# Patient Record
Sex: Female | Born: 1970 | Race: Black or African American | Hispanic: No | Marital: Single | Smoking: Current every day smoker
Health system: Southern US, Community
[De-identification: ages and names within clinical notes are randomized; demographics above are authoritative.]

## PROBLEM LIST (undated history)

## (undated) HISTORY — PX: TUBAL LIGATION: SHX77

---

## 2016-03-30 ENCOUNTER — Emergency Department (HOSPITAL_COMMUNITY)
Admission: EM | Admit: 2016-03-30 | Discharge: 2016-03-31 | Disposition: A | Discharge status: Home / Self Care | Payer: Self-pay | Attending: Emergency Medicine | Admitting: Emergency Medicine

## 2016-03-30 ENCOUNTER — Encounter (HOSPITAL_COMMUNITY): Payer: Self-pay | Admitting: *Deleted

## 2016-03-30 ENCOUNTER — Emergency Department (HOSPITAL_COMMUNITY): Payer: Self-pay

## 2016-03-30 DIAGNOSIS — R109 Unspecified abdominal pain: Secondary | ICD-10-CM | POA: Insufficient documentation

## 2016-03-30 DIAGNOSIS — F17210 Nicotine dependence, cigarettes, uncomplicated: Secondary | ICD-10-CM | POA: Insufficient documentation

## 2016-03-30 LAB — URINE MICROSCOPIC-ADD ON
BACTERIA UA: NONE SEEN
RBC / HPF: NONE SEEN RBC/hpf (ref 0–5)

## 2016-03-30 LAB — URINALYSIS, ROUTINE W REFLEX MICROSCOPIC
Bilirubin Urine: NEGATIVE
Glucose, UA: NEGATIVE mg/dL
HGB URINE DIPSTICK: NEGATIVE
Ketones, ur: NEGATIVE mg/dL
NITRITE: NEGATIVE
PROTEIN: NEGATIVE mg/dL
SPECIFIC GRAVITY, URINE: 1.016 (ref 1.005–1.030)
pH: 7.5 (ref 5.0–8.0)

## 2016-03-30 LAB — BASIC METABOLIC PANEL
ANION GAP: 9 (ref 5–15)
BUN: 12 mg/dL (ref 6–20)
CO2: 24 mmol/L (ref 22–32)
Calcium: 9.3 mg/dL (ref 8.9–10.3)
Chloride: 106 mmol/L (ref 101–111)
Creatinine, Ser: 0.87 mg/dL (ref 0.44–1.00)
Glucose, Bld: 111 mg/dL — ABNORMAL HIGH (ref 65–99)
POTASSIUM: 3.8 mmol/L (ref 3.5–5.1)
SODIUM: 139 mmol/L (ref 135–145)

## 2016-03-30 LAB — PREGNANCY, URINE: Preg Test, Ur: NEGATIVE

## 2016-03-30 LAB — CBC
HEMATOCRIT: 39.7 % (ref 36.0–46.0)
Hemoglobin: 13.1 g/dL (ref 12.0–15.0)
MCH: 26.8 pg (ref 26.0–34.0)
MCHC: 33 g/dL (ref 30.0–36.0)
MCV: 81.4 fL (ref 78.0–100.0)
Platelets: 338 10*3/uL (ref 150–400)
RBC: 4.88 MIL/uL (ref 3.87–5.11)
RDW: 14.7 % (ref 11.5–15.5)
WBC: 6 10*3/uL (ref 4.0–10.5)

## 2016-03-30 MED ORDER — IBUPROFEN 600 MG PO TABS: 600.0000 mg | ORAL_TABLET | Freq: Four times a day (QID) | ORAL | 0 refills | Status: DC | PRN

## 2016-03-30 MED ORDER — IBUPROFEN 800 MG PO TABS
800.0000 mg | ORAL_TABLET | Freq: Once | ORAL | Status: AC
  Administered 2016-03-30: 800 mg via ORAL
  Filled 2016-03-30: qty 1

## 2016-03-30 MED ORDER — CYCLOBENZAPRINE HCL 10 MG PO TABS
5.0000 mg | ORAL_TABLET | Freq: Once | ORAL | Status: AC
  Administered 2016-03-30: 5 mg via ORAL
  Filled 2016-03-30: qty 1

## 2016-03-30 MED ORDER — CYCLOBENZAPRINE HCL 10 MG PO TABS: 5.0000 mg | ORAL_TABLET | Freq: Two times a day (BID) | ORAL | 0 refills | Status: DC | PRN

## 2016-03-30 NOTE — ED Triage Notes (Addendum)
Pt report sharp right flank pain x3 weeks without any urinary symptoms. She reports that she has had lots of gas nut no other symptoms.  Pt reports that this pain has been intermittentt for 3 weeks and she rates it a 8/10  LBM 3 days ago (which is normal for her)

## 2016-03-30 NOTE — ED Provider Notes (Signed)
[facility] Provider Note   CSN: [id number] Arrival date & time: 03/30/16  1541     History   Chief Complaint Chief Complaint  Patient presents with  . Flank Pain    HPI Ruth Collins is a 45 y.o. female.  HPI  Patient comes to the ER  denying any significant PMH with complaints of bilateral side pain and "twitching". It has been persisting for 3 weeks, she currently rates it at a 2-3/10 and describes it as sore but sometimes sharp pains. 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 After work she hurts the worst and has trouble moving her torso in the morning because of the pain. She has had associated bilateral paraspinal back pain. She denies fever, cough, nausea, vomiting, diarrhea, dysuria, or vaginal discharge/abnormal bleeding. Eating does not make this worse. She has tried antacids without relief.   History reviewed. No pertinent past medical history.  There are no active problems to display for this patient.  Past Surgical History:  Procedure Laterality Date  . TUBAL LIGATION     OB History    No data available     Home Medications    Prior to Admission medications   Not on File    Family History No family history on file.  Social History Social History  Substance Use Topics  . Smoking status: Current Every Day Smoker    Packs/day: 1.00    Types: Cigarettes  . Smokeless tobacco: Never Used  . Alcohol use No     Allergies   Review of patient's allergies indicates no known allergies.   Review of Systems Review of Systems  Review of Systems All other systems negative except as documented in the HPI. All pertinent positives and negatives as reviewed in the HPI.   Physical Exam Updated Vital Signs BP 165/94   Pulse 71   Temp 98.3 F (36.8 C)   Resp 18   Wt 77.1 kg   LMP 03/16/2016   SpO2 100%   Physical Exam  Constitutional: She appears well-developed and well-nourished.  HENT:  Head:  Normocephalic and atraumatic.  Eyes: Conjunctivae are normal. Pupils are equal, round, and reactive to light.  Neck: Trachea normal, normal range of motion and full passive range of motion without pain. Neck supple.  Cardiovascular: Normal rate, regular rhythm and normal pulses.   Pulmonary/Chest: Effort normal and breath sounds normal. Chest wall is not dull to percussion. She exhibits no tenderness, no crepitus, no edema, no deformity and no retraction.  Abdominal: Soft. Normal appearance and bowel sounds are normal. She exhibits no distension. There is no tenderness. There is no rigidity, no rebound, no guarding, no CVA tenderness, no tenderness at McBurney's point and negative Murphy's sign.  Pain reproduced with ROM.  Musculoskeletal: Normal range of motion.  Neurological: She is alert. She has normal strength.  Skin: Skin is warm, dry and intact.  Psychiatric: She has a normal mood and affect. Her speech is normal and behavior is normal. Judgment and thought content normal. Cognition and memory are normal.     ED Treatments / Results  Labs (all labs ordered are listed, but only abnormal results are displayed) Labs Reviewed  URINALYSIS, ROUTINE W REFLEX MICROSCOPIC (NOT AT [facility]) - Abnormal; Notable for the following:       Result Value   Color, Urine AMBER (*)    APPearance CLOUDY (*)    Leukocytes, UA SMALL (*)    All  other components within normal limits  BASIC METABOLIC PANEL - Abnormal; Notable for the following:    Glucose, Bld 111 (*)    All other components within normal limits  URINE MICROSCOPIC-ADD ON - Abnormal; Notable for the following:    Squamous Epithelial / LPF 6-30 (*)    All other components within normal limits  CBC  PREGNANCY, URINE  POC URINE PREG, ED    EKG  EKG Interpretation None       Radiology No results found.  Procedures Procedures (including critical care time)  Medications Ordered in ED Medications  ibuprofen (ADVIL,MOTRIN) tablet  800 mg (not administered)  cyclobenzaprine (FLEXERIL) tablet 5 mg (not administered)    Initial Impression / Assessment and Plan / ED Course  I have reviewed the triage vital signs and the nursing notes.  Pertinent labs & imaging results that were available during my care of the patient were reviewed by me and considered in my medical decision making (see chart for details).  Clinical Course    Patients blood work is unremarkable. Abdominal xray shows some mild stool within colon. At this time her pain is suggestive of musculoskeletal wall pain. She has been lifting boxes and has pain to her torso. We discussed appropriate lifting techniques. Will start her on antiinflammatories and muscle relaxers. Will recommend stretching and warm episome salt baths. Very strict return precautions given such as worsening pain, fevers, n/v//d localization of pain or any other new or changing symptoms.  Final Clinical Impressions(s) / ED Diagnoses   Final diagnoses:  Abdominal pain    New Prescriptions New Prescriptions   No medications on file     Darnelle Goingiffany Adalyn Pennock, PA-C 03/30/16 2332    Linwood DibblesJon Knapp, MD 04/01/16 [phone]

## 2016-03-31 NOTE — ED Notes (Signed)
Patient Alert and oriented X4. Stable and ambulatory. Patient verbalized understanding of the discharge instructions.  Patient belongings were taken by the patient.  

## 2018-06-12 ENCOUNTER — Emergency Department (HOSPITAL_COMMUNITY)
Admission: EM | Admit: 2018-06-12 | Discharge: 2018-06-12 | Disposition: A | Discharge status: Home / Self Care | Payer: Self-pay | Attending: Emergency Medicine | Admitting: Emergency Medicine

## 2018-06-12 ENCOUNTER — Other Ambulatory Visit: Payer: Self-pay

## 2018-06-12 ENCOUNTER — Encounter (HOSPITAL_COMMUNITY): Payer: Self-pay | Admitting: Emergency Medicine

## 2018-06-12 DIAGNOSIS — K0889 Other specified disorders of teeth and supporting structures: Secondary | ICD-10-CM

## 2018-06-12 DIAGNOSIS — K029 Dental caries, unspecified: Secondary | ICD-10-CM | POA: Insufficient documentation

## 2018-06-12 DIAGNOSIS — H0013 Chalazion right eye, unspecified eyelid: Secondary | ICD-10-CM | POA: Insufficient documentation

## 2018-06-12 MED ORDER — PENICILLIN V POTASSIUM 500 MG PO TABS: 500.0000 mg | ORAL_TABLET | Freq: Four times a day (QID) | ORAL | 0 refills | Status: AC

## 2018-06-12 MED ORDER — LIDOCAINE VISCOUS HCL 2 % MT SOLN: 15.0000 mL | OROMUCOSAL | 0 refills | Status: DC | PRN

## 2018-06-12 NOTE — ED Provider Notes (Signed)
[facility] Provider Note   CSN: [id number] Arrival date & time: 06/12/18  0820     History   Chief Complaint Chief Complaint  Patient presents with  . Dental Pain  . Facial Swelling    HPI Ruth Collins is a 47 y.o. female with no significant history presents emergency department today for dental pain.  Patient reports that over the last 2 to 3 weeks she has been dealing with left upper dental pain.  She reports that she is new to the area and was told previously that she needed multiple teeth extracted.  She reports she does not have a dentist in the area and is requesting resources.  She reports she is been taking ibuprofen for symptoms with moderate relief.  Patient denies any drainage.  Patient also reports that she has a chalazion of her right eye.  She denies any visual changes, eye redness, eye irritation, eye pain, photophobia, painful ocular movements, or trauma.  Patient does not wear contacts. Denies fever, chills, voice change, inability to control secretions, nausea/vomiting, dysphagia, odynophagia, drainage.    HPI  History reviewed. No pertinent past medical history.  There are no active problems to display for this patient.   Past Surgical History:  Procedure Laterality Date  . TUBAL LIGATION       OB History   No obstetric history on file.      Home Medications    Prior to Admission medications   Medication Sig Start Date End Date Taking? Authorizing Provider  cyclobenzaprine (FLEXERIL) 10 MG tablet Take 0.5-1 tablets (5-10 mg total) by mouth 2 (two) times daily as needed. 03/30/16   Marlon PelGreene, Tiffany, PA-C  ibuprofen (ADVIL,MOTRIN) 600 MG tablet Take 1 tablet (600 mg total) by mouth every 6 (six) hours as needed. 03/30/16   Marlon PelGreene, Tiffany, PA-C    Family History No family history on file.  Social History Social History   Tobacco Use  . Smoking status: Current Every Day Smoker    Packs/day: 1.00   Types: Cigarettes  . Smokeless tobacco: Never Used  Substance Use Topics  . Alcohol use: No  . Drug use: Not on file     Allergies   Patient has no known allergies.   Review of Systems Review of Systems  Constitutional: Negative for chills and fever.  HENT: Positive for dental problem. Negative for congestion, drooling, ear discharge, ear pain, facial swelling, hearing loss, mouth sores, nosebleeds, postnasal drip, rhinorrhea, sinus pressure, sinus pain, sneezing, sore throat, tinnitus, trouble swallowing and voice change.   Eyes: Negative for photophobia, pain, discharge, redness, itching and visual disturbance.  Musculoskeletal: Negative for neck stiffness.  Neurological: Negative for headaches.     Physical Exam Updated Vital Signs BP (!) 178/96 (BP Location: Right Arm)   Pulse 65   Temp 98 F (36.7 C) (Oral)   Resp 17   Ht 5' 7.5" (1.715 m)   Wt 72.6 kg   LMP 03/16/2016   SpO2 100%   BMI 24.69 kg/m   Physical Exam Vitals signs and nursing note reviewed.  Constitutional:      Appearance: She is well-developed.  HENT:     Head: Normocephalic and atraumatic.     Right Ear: External ear normal.     Left Ear: External ear normal.     Nose: Nose normal.     Mouth/Throat:      Comments: The patient has normal phonation and is in control of secretions. No stridor.  Midline uvula without edema. Soft palate rises symmetrically.  No tonsillar erythema or exudates. No PTA. Tongue protrusion is normal. No trismus. No creptius on neck palpation. Patient has poor dentition.  Patient with tenderness and cavity noted to teeth indicated in diagram.  No facial swelling.  No gingival erythema or fluctuance noted. Mucus membranes moist.  Eyes:     General: No scleral icterus.       Right eye: No discharge.        Left eye: No discharge.     Conjunctiva/sclera: Conjunctivae normal.     Comments: Chalazion of the right eye  Neck:     Comments: No submandibular or submental edema  or lymphadenopathy.  No meningeal signs.  No crepitus on neck palpation. Pulmonary:     Effort: Pulmonary effort is normal. No respiratory distress.  Skin:    Coloration: Skin is not pale.     Comments: No facial rash.   Neurological:     Mental Status: She is alert.      ED Treatments / Results  Labs (all labs ordered are listed, but only abnormal results are displayed) Labs Reviewed - No data to display  EKG None  Radiology No results found.  Procedures Procedures (including critical care time)  Medications Ordered in ED Medications - No data to display   Initial Impression / Assessment and Plan / ED Course  I have reviewed the triage vital signs and the nursing notes.  Pertinent labs & imaging results that were available during my care of the patient were reviewed by me and considered in my medical decision making (see chart for details).     47 y.o. female with Patient with toothache.  No gross abscess.  Exam unconcerning for Ludwig's angina or spread of infection.  Will treat with penicillin and recommend continuing anti-inflammatories medicine.  Urged patient to follow-up with dentist. Patient also with a chalazion of the right eye.  Patient denies any visual changes.  No conjunctival or scleral injection.  Return precautions discussed.  Final Clinical Impressions(s) / ED Diagnoses   Final diagnoses:  Pain, dental    ED Discharge Orders         Ordered    lidocaine (XYLOCAINE) 2 % solution  As needed     06/12/18 0924    penicillin v potassium (VEETID) 500 MG tablet  4 times daily     06/12/18 [id number]           Jacinto HalimMaczis, Harlan Vinal M, PA-C 06/12/18 [id number]    Sabas SousBero, Cyntha Brickman M, MD 06/15/18 [phone]

## 2018-06-12 NOTE — Discharge Instructions (Signed)
Please read and follow all provided instructions.  Your diagnoses today include:  1. Pain, dental     The exam and treatment you received today has been provided on an emergency basis only. This is not a substitute for complete medical or dental care. This problem will not resolve on its own without the care of a dentist.  Tests performed today include: Vital signs. See below for your results today.   Medications prescribed:   Take any prescribed medications only as directed. Use ibuprofen or naproxen for pain. Use the viscous lidocaine for mouth pain. Swish with the lidocaine and spit it out. Do not swallow it.  Please take all of your antibiotics until finished!   You may develop abdominal discomfort or diarrhea from the antibiotic.  You may help offset this with probiotics which you can buy or get in yogurt. Do not eat or take the probiotics until 2 hours after your antibiotic. Do not take your medicine if develop an itchy rash, swelling in your mouth or lips, or difficulty breathing.   Home care instructions:  Follow any educational materials contained in this packet.  Follow-up instructions: Please follow-up with your dentist for further evaluation of your symptoms.   Dental Assistance: See handout for dental referrals  Return instructions:  Please return to the Emergency Department if you experience worsening symptoms. Please return if you develop a fever, you develop more swelling in your face or neck, you have trouble breathing or swallowing food. Please return if you have any other emergent concerns.  Additional Information:  Your vital signs today were: BP (!) 178/96 (BP Location: Right Arm)    Pulse 65    Temp 98 F (36.7 C) (Oral)    Resp 17    Ht 5' 7.5" (1.715 m)    Wt 72.6 kg    LMP 03/16/2016    SpO2 100%    BMI 24.69 kg/m  If your blood pressure (BP) was elevated above 135/85 this visit, please have this repeated by your doctor within one  month. --------------

## 2018-06-12 NOTE — ED Triage Notes (Signed)
Pt. Stated, I ve had a toothache for 2 weeks and the swelling started yesterday. My eyes are swollen today.

## 2019-11-20 ENCOUNTER — Encounter (HOSPITAL_COMMUNITY): Payer: Self-pay

## 2019-11-20 ENCOUNTER — Other Ambulatory Visit: Payer: Self-pay

## 2019-11-20 ENCOUNTER — Ambulatory Visit (HOSPITAL_COMMUNITY)
Admission: EM | Admit: 2019-11-20 | Discharge: 2019-11-20 | Disposition: A | Discharge status: Home / Self Care | Payer: 59 | Attending: Emergency Medicine | Admitting: Emergency Medicine

## 2019-11-20 DIAGNOSIS — R05 Cough: Secondary | ICD-10-CM | POA: Diagnosis not present

## 2019-11-20 DIAGNOSIS — R059 Cough, unspecified: Secondary | ICD-10-CM

## 2019-11-20 DIAGNOSIS — J019 Acute sinusitis, unspecified: Secondary | ICD-10-CM | POA: Diagnosis not present

## 2019-11-20 MED ORDER — FLUTICASONE PROPIONATE 50 MCG/ACT NA SUSP: 1.0000 | Freq: Every day | NASAL | 0 refills | Status: DC

## 2019-11-20 MED ORDER — PREDNISONE 50 MG PO TABS: 50.0000 mg | ORAL_TABLET | Freq: Every day | ORAL | 0 refills | Status: AC

## 2019-11-20 MED ORDER — BENZONATATE 200 MG PO CAPS: 200.0000 mg | ORAL_CAPSULE | Freq: Three times a day (TID) | ORAL | 0 refills | Status: AC | PRN

## 2019-11-20 MED ORDER — DOXYCYCLINE HYCLATE 100 MG PO CAPS: 100.0000 mg | ORAL_CAPSULE | Freq: Two times a day (BID) | ORAL | 0 refills | Status: AC

## 2019-11-20 MED ORDER — CETIRIZINE HCL 10 MG PO CAPS: 10.0000 mg | ORAL_CAPSULE | Freq: Every day | ORAL | 0 refills | Status: DC

## 2019-11-20 NOTE — ED Triage Notes (Signed)
C/o productive cough x1 month. Reports that when she wakes up in the morning there is a thick layer of mucous on the roof of her mouth. Denies any other symptoms. Denies pain.

## 2019-11-20 NOTE — ED Provider Notes (Signed)
[facility]    CSN: [id number] Arrival date & time: 11/20/19  1043      History   Chief Complaint Chief Complaint  Patient presents with  . Cough    productive    HPI Ruth Collins is a 49 y.o. female presenting today for evaluation of a cough. Patient has had a cough for the past 1 month. The cough is productive and reports a lot of mucus in throat/mouth especially in the mornings. She has had some associated sneezing and watery eyes along with rhinorrhea. Denies fevers. Denies chest pain or shortness of breath. Does report tobacco use, approximately 0.75 pack per day. Has used some Benadryl, but no significant use of OTC meds for symptom relief.  HPI  History reviewed. No pertinent past medical history.  There are no problems to display for this patient.   Past Surgical History:  Procedure Laterality Date  . TUBAL LIGATION      OB History   No obstetric history on file.      Home Medications    Prior to Admission medications   Medication Sig Start Date End Date Taking? Authorizing Provider  benzonatate (TESSALON) 200 MG capsule Take 1 capsule (200 mg total) by mouth 3 (three) times daily as needed for up to 7 days for cough. 11/20/19 11/27/19  Jetty Berland C, PA-C  Cetirizine HCl 10 MG CAPS Take 1 capsule (10 mg total) by mouth daily for 10 days. 11/20/19 11/30/19  Lorcan Shelp C, PA-C  cyclobenzaprine (FLEXERIL) 10 MG tablet Take 0.5-1 tablets (5-10 mg total) by mouth 2 (two) times daily as needed. 03/30/16   Marlon Pel, PA-C  doxycycline (VIBRAMYCIN) 100 MG capsule Take 1 capsule (100 mg total) by mouth 2 (two) times daily for 10 days. 11/20/19 11/30/19  Kyandra Mcclaine C, PA-C  fluticasone (FLONASE) 50 MCG/ACT nasal spray Place 1-2 sprays into both nostrils daily for 7 days. 11/20/19 11/27/19  Rylee Nuzum C, PA-C  ibuprofen (ADVIL,MOTRIN) 600 MG tablet Take 1 tablet (600 mg total) by mouth every 6 (six) hours as needed. 03/30/16   Marlon Pel,  PA-C  lidocaine (XYLOCAINE) 2 % solution Use as directed 15 mLs in the mouth or throat as needed for mouth pain. 06/12/18   Maczis, Elmer Sow, PA-C  predniSONE (DELTASONE) 50 MG tablet Take 1 tablet (50 mg total) by mouth daily for 5 days. 11/20/19 11/25/19  Leonarda Leis, Junius Creamer, PA-C    Family History No family history on file.  Social History Social History   Tobacco Use  . Smoking status: Current Every Day Smoker    Packs/day: 0.75    Types: Cigarettes  . Smokeless tobacco: Never Used  Substance Use Topics  . Alcohol use: No  . Drug use: Not on file     Allergies   Patient has no known allergies.   Review of Systems Review of Systems  Constitutional: Negative for activity change, appetite change, chills, fatigue and fever.  HENT: Positive for congestion and rhinorrhea. Negative for ear pain, sinus pressure, sore throat and trouble swallowing.   Eyes: Negative for discharge and redness.  Respiratory: Positive for cough. Negative for chest tightness and shortness of breath.   Cardiovascular: Negative for chest pain.  Gastrointestinal: Negative for abdominal pain, diarrhea, nausea and vomiting.  Musculoskeletal: Negative for myalgias.  Skin: Negative for rash.  Neurological: Negative for dizziness, light-headedness and headaches.     Physical Exam Triage Vital Signs ED Triage Vitals  Enc Vitals Group  BP 11/20/19 1223 (!) 157/84     Pulse Rate 11/20/19 1223 66     Resp 11/20/19 1223 14     Temp 11/20/19 1223 98.4 F (36.9 C)     Temp Source 11/20/19 1223 Oral     SpO2 11/20/19 1223 100 %     Weight --      Height --      Head Circumference --      Peak Flow --      Pain Score 11/20/19 1222 0     Pain Loc --      Pain Edu? --      Excl. in [place]? --    No data found.  Updated Vital Signs BP (!) 157/84 (BP Location: Right Arm)   Pulse 66   Temp 98.4 F (36.9 C) (Oral)   Resp 14   LMP 03/16/2016   SpO2 100%   Visual Acuity Right Eye Distance:   Left Eye  Distance:   Bilateral Distance:    Right Eye Near:   Left Eye Near:    Bilateral Near:     Physical Exam Vitals and nursing note reviewed.  Constitutional:      Appearance: She is well-developed.     Comments: No acute distress  HENT:     Head: Normocephalic and atraumatic.     Ears:     Comments: Bilateral ears without tenderness to palpation of external auricle, tragus and mastoid, EAC's without erythema or swelling, TM's with good bony landmarks and cone of light. Non erythematous.     Nose: Nose normal.     Mouth/Throat:     Comments: Oral mucosa pink and moist, no tonsillar enlargement or exudate. Posterior pharynx patent and nonerythematous, no uvula deviation or swelling. Normal phonation.  Eyes:     Conjunctiva/sclera: Conjunctivae normal.  Cardiovascular:     Rate and Rhythm: Normal rate.  Pulmonary:     Effort: Pulmonary effort is normal. No respiratory distress.     Comments: Breathing comfortably at rest, CTABL, no wheezing, rales or other adventitious sounds auscultated Abdominal:     General: There is no distension.  Musculoskeletal:        General: Normal range of motion.     Cervical back: Neck supple.  Skin:    General: Skin is warm and dry.  Neurological:     Mental Status: She is alert and oriented to person, place, and time.      UC Treatments / Results  Labs (all labs ordered are listed, but only abnormal results are displayed) Labs Reviewed - No data to display  EKG   Radiology No results found.  Procedures Procedures (including critical care time)  Medications Ordered in UC Medications - No data to display  Initial Impression / Assessment and Plan / UC Course  I have reviewed the triage vital signs and the nursing notes.  Pertinent labs & imaging results that were available during my care of the patient were reviewed by me and considered in my medical decision making (see chart for details).     URI symptoms and cough x1 month,  lungs clear, vital signs stable, treating for sinusitis/bronchitis and initiated on doxycycline, prednisone along with cetirizine and Flonase and Tessalon for further symptomatic relief. Rest and fluids.  Discussed strict return precautions. Patient verbalized understanding and is agreeable with plan.  Final Clinical Impressions(s) / UC Diagnoses   Final diagnoses:  Acute sinusitis with symptoms > 10 days  Cough  Discharge Instructions     Please begin doxycycline twice daily x5 days Prednisone daily x5 days, take with food and in the morning Begin daily cetirizine and Flonase nasal spray 1 to 2 spray in each nostril daily benzonatate/Tessalon every 8 hours as needed for deformity Rest and fluids  Please follow-up if any symptoms not improving or worsening    ED Prescriptions    Medication Sig Dispense Auth. Provider   doxycycline (VIBRAMYCIN) 100 MG capsule Take 1 capsule (100 mg total) by mouth 2 (two) times daily for 10 days. 20 capsule Gretta Samons C, PA-C   predniSONE (DELTASONE) 50 MG tablet Take 1 tablet (50 mg total) by mouth daily for 5 days. 5 tablet Ryder Chesmore C, PA-C   benzonatate (TESSALON) 200 MG capsule Take 1 capsule (200 mg total) by mouth 3 (three) times daily as needed for up to 7 days for cough. 28 capsule Jacelyn Cuen C, PA-C   Cetirizine HCl 10 MG CAPS Take 1 capsule (10 mg total) by mouth daily for 10 days. 10 capsule Dariel Pellecchia C, PA-C   fluticasone (FLONASE) 50 MCG/ACT nasal spray Place 1-2 sprays into both nostrils daily for 7 days. 1 g Fannye Myer, [place] C, PA-C     PDMP not reviewed this encounter.   Lew Dawes, [place] 11/20/19 1251

## 2019-11-20 NOTE — Discharge Instructions (Signed)
Please begin doxycycline twice daily x5 days Prednisone daily x5 days, take with food and in the morning Begin daily cetirizine and Flonase nasal spray 1 to 2 spray in each nostril daily benzonatate/Tessalon every 8 hours as needed for deformity Rest and fluids  Please follow-up if any symptoms not improving or worsening

## 2019-12-22 ENCOUNTER — Ambulatory Visit (HOSPITAL_COMMUNITY)
Admission: EM | Admit: 2019-12-22 | Discharge: 2019-12-22 | Disposition: A | Discharge status: Home / Self Care | Payer: 59 | Attending: Family Medicine | Admitting: Family Medicine

## 2019-12-22 ENCOUNTER — Encounter (HOSPITAL_COMMUNITY): Payer: Self-pay

## 2019-12-22 ENCOUNTER — Other Ambulatory Visit: Payer: Self-pay

## 2019-12-22 DIAGNOSIS — M62838 Other muscle spasm: Secondary | ICD-10-CM | POA: Diagnosis not present

## 2019-12-22 DIAGNOSIS — M791 Myalgia, unspecified site: Secondary | ICD-10-CM | POA: Diagnosis not present

## 2019-12-22 DIAGNOSIS — M62830 Muscle spasm of back: Secondary | ICD-10-CM | POA: Diagnosis not present

## 2019-12-22 MED ORDER — CYCLOBENZAPRINE HCL 10 MG PO TABS: 10.0000 mg | ORAL_TABLET | Freq: Two times a day (BID) | ORAL | 0 refills | Status: DC | PRN

## 2019-12-22 NOTE — ED Provider Notes (Signed)
[facility]   [id number] 12/22/19 Arrival Time: 1004  [id number] PAIN  SUBJECTIVE: History from: patient. Ruth Collins is a 49 y.o. female complains of bilateral leg cramps and muscle spasms.  Also reports back spasms and pain to both sides of her low back.  Reports neck pain and spasms as well.  Reports that she is taken Advil with no relief.  Denies taking any muscle relaxers.  Reports this is been going on for the last 3 days.  Denies a precipitating event or specific injury.  Symptoms are made worse with activity.  Denies similar symptoms in the past.  Denies fever, chills, erythema, ecchymosis, effusion, weakness, numbness and tingling, saddle paresthesias, loss of bowel or bladder function.      ROS: As per HPI.  All other pertinent ROS negative.     History reviewed. No pertinent past medical history. Past Surgical History:  Procedure Laterality Date  . TUBAL LIGATION     No Known Allergies No current facility-administered medications on file prior to encounter.   Current Outpatient Medications on File Prior to Encounter  Medication Sig Dispense Refill  . Cetirizine HCl 10 MG CAPS Take 1 capsule (10 mg total) by mouth daily for 10 days. 10 capsule 0  . fluticasone (FLONASE) 50 MCG/ACT nasal spray Place 1-2 sprays into both nostrils daily for 7 days. 1 g 0  . ibuprofen (ADVIL,MOTRIN) 600 MG tablet Take 1 tablet (600 mg total) by mouth every 6 (six) hours as needed. 30 tablet 0  . lidocaine (XYLOCAINE) 2 % solution Use as directed 15 mLs in the mouth or throat as needed for mouth pain. 100 mL 0   Social History   Socioeconomic History  . Marital status: Single    Spouse name: Not on file  . Number of children: Not on file  . Years of education: Not on file  . Highest education level: Not on file  Occupational History  . Not on file  Tobacco Use  . Smoking status: Current Every Day Smoker    Packs/day: 0.75    Types: Cigarettes  . Smokeless tobacco: Never  Used  Vaping Use  . Vaping Use: Never used  Substance and Sexual Activity  . Alcohol use: No  . Drug use: Not on file  . Sexual activity: Not on file  Other Topics Concern  . Not on file  Social History Narrative  . Not on file   Social Determinants of Health   Financial Resource Strain:   . Difficulty of Paying Living Expenses:   Food Insecurity:   . Worried About Programme researcher, broadcasting/film/video in the Last Year:   . Barista in the Last Year:   Transportation Needs:   . Freight forwarder (Medical):   Marland Kitchen Lack of Transportation (Non-Medical):   Physical Activity:   . Days of Exercise per Week:   . Minutes of Exercise per Session:   Stress:   . Feeling of Stress :   Social Connections:   . Frequency of Communication with Friends and Family:   . Frequency of Social Gatherings with Friends and Family:   . Attends Religious Services:   . Active Member of Clubs or Organizations:   . Attends Banker Meetings:   Marland Kitchen Marital Status:   Intimate Partner Violence:   . Fear of Current or Ex-Partner:   . Emotionally Abused:   Marland Kitchen Physically Abused:   . Sexually Abused:    History reviewed. No pertinent family  history.  OBJECTIVE:  Vitals:   12/22/19 1037  BP: (!) 142/96  Pulse: 77  Resp: 18  Temp: 98.3 F (36.8 C)  TempSrc: Oral  SpO2: 100%    General appearance: ALERT; in no acute distress.  Head: NCAT Lungs: Normal respiratory effort CV: XX pulses 2+ bilaterally. Cap refill < 2 seconds Musculoskeletal:  Inspection: Skin warm, dry, clear and intact without obvious erythema, effusion, or ecchymosis.  Palpation: Mid back bilaterally in spasm and tender to palpation ROM: FROM active and passive Skin: warm and dry Neurologic: Ambulates without difficulty; Sensation intact about the upper/ lower extremities Psychological: alert and cooperative; normal mood and affect  DIAGNOSTIC STUDIES:  No results found.   ASSESSMENT & PLAN:  1. Muscle pain   2.  Muscle spasms of both lower extremities   3. Muscle spasm of back   4. Muscle spasms of neck       Meds ordered this encounter  Medications  . cyclobenzaprine (FLEXERIL) 10 MG tablet    Sig: Take 1 tablet (10 mg total) by mouth 2 (two) times daily as needed for muscle spasms.    Dispense:  20 tablet    Refill:  0    Order Specific Question:   Supervising Provider    Answer:   Merrilee Jansky [id number]    Continue conservative management of rest, ice, and gentle stretches Take ibuprofen as needed for pain relief (may cause abdominal discomfort, ulcers, and GI bleeds avoid taking with other NSAIDs) Take cyclobenzaprine at nighttime for symptomatic relief. Avoid driving or operating heavy machinery while using medication. May use ice or heat May supplement with magnesium as well Follow up with PCP if symptoms persist Return or go to the ER if you have any new or worsening symptoms (fever, chills, chest pain, abdominal pain, changes in bowel or bladder habits, pain radiating into lower legs)   Reviewed expectations re: course of current medical issues. Questions answered. Outlined signs and symptoms indicating need for more acute intervention. Patient verbalized understanding. After Visit Summary given.       Moshe Cipro, NP 12/22/19 1123

## 2019-12-22 NOTE — Discharge Instructions (Signed)
I sent in a muscle relaxer for you to take  You may take the muscle relaxer cyclobenzaprine twice daily as needed for muscle spasms.  This medication can make you very sleepy.  I would also have you pick up plain magnesium or magnesium chloride at least 300 mg daily to help with muscle spasms and pain  Follow-up with this office or primary care if you are not improving  Follow-up with the ER for unrelenting pain, trouble swallowing, trouble breathing, other concerning symptoms

## 2019-12-22 NOTE — ED Triage Notes (Signed)
Pt present sharp pain that starts from the bottom of both feet and works it way up to the her abdominal area. Symptoms started 3 days ago. Pt describe pain as a sharp pain like muscle spasms.

## 2020-10-09 ENCOUNTER — Encounter (HOSPITAL_COMMUNITY): Payer: Self-pay | Admitting: Emergency Medicine

## 2020-10-09 ENCOUNTER — Other Ambulatory Visit: Payer: Self-pay

## 2020-10-09 ENCOUNTER — Ambulatory Visit (HOSPITAL_COMMUNITY)
Admission: EM | Admit: 2020-10-09 | Discharge: 2020-10-09 | Disposition: A | Discharge status: Home / Self Care | Payer: BC Managed Care – PPO | Attending: Physician Assistant | Admitting: Physician Assistant

## 2020-10-09 DIAGNOSIS — Z20822 Contact with and (suspected) exposure to covid-19: Secondary | ICD-10-CM | POA: Diagnosis not present

## 2020-10-09 DIAGNOSIS — F17210 Nicotine dependence, cigarettes, uncomplicated: Secondary | ICD-10-CM | POA: Insufficient documentation

## 2020-10-09 DIAGNOSIS — J069 Acute upper respiratory infection, unspecified: Secondary | ICD-10-CM | POA: Insufficient documentation

## 2020-10-09 LAB — POC INFLUENZA A AND B ANTIGEN (URGENT CARE ONLY)
INFLUENZA A ANTIGEN, POC: NEGATIVE
INFLUENZA B ANTIGEN, POC: NEGATIVE

## 2020-10-09 LAB — SARS CORONAVIRUS 2 (TAT 6-24 HRS): SARS Coronavirus 2: NEGATIVE

## 2020-10-09 MED ORDER — BENZONATATE 100 MG PO CAPS: 100.0000 mg | ORAL_CAPSULE | Freq: Three times a day (TID) | ORAL | 0 refills | Status: DC

## 2020-10-09 MED ORDER — PROMETHAZINE-DM 6.25-15 MG/5ML PO SYRP: 5.0000 mL | ORAL_SOLUTION | Freq: Every evening | ORAL | 0 refills | Status: DC | PRN

## 2020-10-09 NOTE — Discharge Instructions (Addendum)
Your flu testing was negative.  We will be in touch with your COVID-19 results as soon as we have them.  Use Promethazine DM for nocturnal cough symptoms but do not drive or drink alcohol with this medication as drowsiness is a common side effect.  He can use Tessalon for cough during the day.  Please use Mucinex and Flonase for additional symptom relief.  Make sure you drink plenty of fluid.  If anything worsens please return for further evaluation.

## 2020-10-09 NOTE — ED Triage Notes (Signed)
Pt presents today with c/o of sore throat and headache that began yesterday. Denies fever.

## 2020-10-09 NOTE — ED Provider Notes (Signed)
[facility]    CSN: [id number] Arrival date & time: 10/09/20  2423      History   Chief Complaint Chief Complaint  Patient presents with  . Sore Throat  . Headache  . Nasal Congestion    HPI Ruth Collins is a 50 y.o. female.   Patient presents today with a 2-day history of sore throat.  Reports associated headache, rhinorrhea, postnasal drainage, fatigue, diarrhea, sinus pressure.  Denies fever, chest pain, shortness of breath, nausea, vomiting.  She has tried Benadryl without improvement of symptoms.  She has a history of seasonal allergies but reports current symptoms are more intense than typical allergies.  She denies any known sick contacts.  Denies any recent antibiotic use.  She does not receive influenza or COVID-19 vaccinations.  She is a current everyday smoker smoking approximately 0.5 packs/day which is her typical amount.  Denies history of asthma or COPD.  She has missed work as a result of symptoms and is requesting work excuse note today.     History reviewed. No pertinent past medical history.  There are no problems to display for this patient.   Past Surgical History:  Procedure Laterality Date  . TUBAL LIGATION      OB History   No obstetric history on file.      Home Medications    Prior to Admission medications   Medication Sig Start Date End Date Taking? Authorizing Provider  benzonatate (TESSALON) 100 MG capsule Take 1 capsule (100 mg total) by mouth every 8 (eight) hours. 10/09/20  Yes Alic Hilburn K, PA-C  diphenhydrAMINE (BENADRYL) 50 MG capsule Take 50 mg by mouth every 6 (six) hours as needed.   Yes [provider]  ibuprofen (ADVIL,MOTRIN) 600 MG tablet Take 1 tablet (600 mg total) by mouth every 6 (six) hours as needed. 03/30/16  Yes Neva Seat, Tiffany, PA-C  promethazine-dextromethorphan (PROMETHAZINE-DM) 6.25-15 MG/5ML syrup Take 5 mLs by mouth at bedtime as needed for cough. 10/09/20  Yes Capricia Serda, Denny Peon K, PA-C   Cetirizine HCl 10 MG CAPS Take 1 capsule (10 mg total) by mouth daily for 10 days. 11/20/19 11/30/19  Wieters, Hallie C, PA-C  fluticasone (FLONASE) 50 MCG/ACT nasal spray Place 1-2 sprays into both nostrils daily for 7 days. 11/20/19 11/27/19  Wieters, Junius Creamer, PA-C    Family History Family History  Problem Relation Age of Onset  . Diabetes Mother   . Hypertension Mother   . Diabetes Father   . Hypertension Father     Social History Social History   Tobacco Use  . Smoking status: Current Every Day Smoker    Packs/day: 0.75    Types: Cigarettes  . Smokeless tobacco: Never Used  Vaping Use  . Vaping Use: Never used  Substance Use Topics  . Alcohol use: No  . Drug use: Never     Allergies   Patient has no known allergies.   Review of Systems Review of Systems  Constitutional: Positive for activity change and fatigue. Negative for appetite change and fever.  HENT: Positive for congestion, postnasal drip, sinus pressure and sore throat. Negative for ear pain and sneezing.   Respiratory: Positive for cough. Negative for shortness of breath.   Cardiovascular: Negative for chest pain.  Gastrointestinal: Positive for diarrhea. Negative for abdominal pain, nausea and vomiting.  Musculoskeletal: Positive for back pain. Negative for arthralgias and myalgias.  Neurological: Positive for headaches. Negative for dizziness and light-headedness.     Physical Exam Triage Vital Signs ED  Triage Vitals  Enc Vitals Group     BP 10/09/20 1139 (!) 162/99     Pulse Rate 10/09/20 1139 77     Resp 10/09/20 1139 18     Temp 10/09/20 1139 98.5 F (36.9 C)     Temp Source 10/09/20 1139 Oral     SpO2 10/09/20 1139 99 %     Weight --      Height --      Head Circumference --      Peak Flow --      Pain Score 10/09/20 1136 6     Pain Loc --      Pain Edu? --      Excl. in GC? --    No data found.  Updated Vital Signs BP (!) 162/99 (BP Location: Right Arm)   Pulse 77   Temp 98.5 F  (36.9 C) (Oral)   Resp 18   LMP 03/16/2016   SpO2 99%   Visual Acuity Right Eye Distance:   Left Eye Distance:   Bilateral Distance:    Right Eye Near:   Left Eye Near:    Bilateral Near:     Physical Exam Vitals reviewed.  Constitutional:      General: She is awake. She is not in acute distress.    Appearance: Normal appearance. She is not ill-appearing.     Comments: Very pleasant female appears stated age in no acute distress  HENT:     Head: Normocephalic and atraumatic.     Right Ear: Ear canal and external ear normal. A middle ear effusion is present. Tympanic membrane is not erythematous or bulging.     Left Ear: Ear canal and external ear normal. A middle ear effusion is present. Tympanic membrane is not erythematous or bulging.     Nose:     Right Sinus: Frontal sinus tenderness present. No maxillary sinus tenderness.     Left Sinus: Frontal sinus tenderness present. No maxillary sinus tenderness.     Mouth/Throat:     Pharynx: Uvula midline. Posterior oropharyngeal erythema present. No oropharyngeal exudate.     Comments: Moderate erythema and drainage in posterior oropharynx Cardiovascular:     Rate and Rhythm: Normal rate and regular rhythm.     Heart sounds: No murmur heard.   Pulmonary:     Effort: Pulmonary effort is normal.     Breath sounds: Normal breath sounds. No wheezing, rhonchi or rales.     Comments: Clear to auscultation bilaterally Lymphadenopathy:     Head:     Right side of head: No submental, submandibular or tonsillar adenopathy.     Left side of head: No submental, submandibular or tonsillar adenopathy.     Cervical: No cervical adenopathy.  Psychiatric:        Behavior: Behavior is cooperative.      UC Treatments / Results  Labs (all labs ordered are listed, but only abnormal results are displayed) Labs Reviewed  SARS CORONAVIRUS 2 (TAT 6-24 HRS)  POC INFLUENZA A AND B ANTIGEN (URGENT CARE ONLY)    EKG   Radiology No  results found.  Procedures Procedures (including critical care time)  Medications Ordered in UC Medications - No data to display  Initial Impression / Assessment and Plan / UC Course  I have reviewed the triage vital signs and the nursing notes.  Pertinent labs & imaging results that were available during my care of the patient were reviewed by me and considered in my medical  decision making (see chart for details).     Flu testing was negative in office today.  COVID-19 testing pending.  Suspect viral etiology given short duration.  Patient prescribed Promethazine DM for nocturnal cough symptoms with instruction not to drive or drink alcohol with this medication as drowsiness is a common side effect.  She can use Tessalon for diurnal cough symptoms.  Recommended Mucinex and Flonase for symptom relief.  Strict return precautions given to which patient expressed understanding.  Final Clinical Impressions(s) / UC Diagnoses   Final diagnoses:  Viral URI with cough     Discharge Instructions     Your flu testing was negative.  We will be in touch with your COVID-19 results as soon as we have them.  Use Promethazine DM for nocturnal cough symptoms but do not drive or drink alcohol with this medication as drowsiness is a common side effect.  He can use Tessalon for cough during the day.  Please use Mucinex and Flonase for additional symptom relief.  Make sure you drink plenty of fluid.  If anything worsens please return for further evaluation.    ED Prescriptions    Medication Sig Dispense Auth. Provider   promethazine-dextromethorphan (PROMETHAZINE-DM) 6.25-15 MG/5ML syrup Take 5 mLs by mouth at bedtime as needed for cough. 118 mL Alyra Patty K, PA-C   benzonatate (TESSALON) 100 MG capsule Take 1 capsule (100 mg total) by mouth every 8 (eight) hours. 21 capsule Alyaan Budzynski K, PA-C     PDMP not reviewed this encounter.   Jeani Hawking, PA-C 10/09/20 1232

## 2021-08-09 ENCOUNTER — Other Ambulatory Visit: Payer: Self-pay

## 2021-08-09 ENCOUNTER — Ambulatory Visit (HOSPITAL_COMMUNITY)
Admission: EM | Admit: 2021-08-09 | Discharge: 2021-08-09 | Disposition: A | Discharge status: Home / Self Care | Payer: BC Managed Care – PPO | Attending: Physician Assistant | Admitting: Physician Assistant

## 2021-08-09 ENCOUNTER — Encounter (HOSPITAL_COMMUNITY): Payer: Self-pay | Admitting: Emergency Medicine

## 2021-08-09 DIAGNOSIS — J019 Acute sinusitis, unspecified: Secondary | ICD-10-CM | POA: Diagnosis not present

## 2021-08-09 DIAGNOSIS — K219 Gastro-esophageal reflux disease without esophagitis: Secondary | ICD-10-CM

## 2021-08-09 MED ORDER — PROMETHAZINE-DM 6.25-15 MG/5ML PO SYRP: 5.0000 mL | ORAL_SOLUTION | Freq: Four times a day (QID) | ORAL | 0 refills | Status: DC | PRN

## 2021-08-09 MED ORDER — FLUTICASONE PROPIONATE 50 MCG/ACT NA SUSP: 2.0000 | Freq: Every day | NASAL | 2 refills | Status: DC

## 2021-08-09 MED ORDER — AMOXICILLIN-POT CLAVULANATE 875-125 MG PO TABS: 1.0000 | ORAL_TABLET | Freq: Two times a day (BID) | ORAL | 0 refills | Status: DC

## 2021-08-09 NOTE — Discharge Instructions (Addendum)
Take medications as prescribed Drink plenty of fluids Rest Recommend omeprazole over the counter daily for acid reflux

## 2021-08-09 NOTE — ED Triage Notes (Addendum)
Extremities sore, epigastric pain, throat dry and runny nose started last week.  Complains of cold and hot spells  Left fingertip numbness in mornings for 2 months.

## 2021-08-09 NOTE — ED Provider Notes (Signed)
[facility]    CSN: [id number] Arrival date & time: 08/09/21  1028      History   Chief Complaint Chief Complaint  Patient presents with   Abdominal Pain    HPI Ruth Collins is a 51 y.o. female.   Pt complains of congestion, cough, and headache that started a week ago.  She reports cough is worse at night when lying flat.  She also complains of body aches. She has taken a home COVID test which was negative.  Pt has tried Mucinex with minimal relief.  She also reports some epigastric discomfort that started several weeks ago.  Denies radiation of pain, feels better after eating.  She has taken nothing for the discomfort.  Reports she has experienced more burping than normal.    History reviewed. No pertinent past medical history.  There are no problems to display for this patient.   Past Surgical History:  Procedure Laterality Date   TUBAL LIGATION      OB History   No obstetric history on file.      Home Medications    Prior to Admission medications   Medication Sig Start Date End Date Taking? Authorizing Provider  amoxicillin-clavulanate (AUGMENTIN) 875-125 MG tablet Take 1 tablet by mouth every 12 (twelve) hours. 08/09/21  Yes Ward, Tylene Fantasia, PA-C  fluticasone (FLONASE) 50 MCG/ACT nasal spray Place 2 sprays into both nostrils daily. 08/09/21  Yes Ward, Tylene Fantasia, PA-C  promethazine-dextromethorphan (PROMETHAZINE-DM) 6.25-15 MG/5ML syrup Take 5 mLs by mouth 4 (four) times daily as needed for cough. 08/09/21  Yes Ward, Tylene Fantasia, PA-C  benzonatate (TESSALON) 100 MG capsule Take 1 capsule (100 mg total) by mouth every 8 (eight) hours. Patient not taking: Reported on 08/09/2021 10/09/20   Raspet, Noberto Retort, PA-C  Cetirizine HCl 10 MG CAPS Take 1 capsule (10 mg total) by mouth daily for 10 days. Patient not taking: Reported on 08/09/2021 11/20/19 11/30/19  Wieters, Hallie C, PA-C  diphenhydrAMINE (BENADRYL) 50 MG capsule Take 50 mg by mouth every 6 (six) hours as  needed. Patient not taking: Reported on 08/09/2021    [provider]  ibuprofen (ADVIL,MOTRIN) 600 MG tablet Take 1 tablet (600 mg total) by mouth every 6 (six) hours as needed. 03/30/16   Marlon Pel, PA-C    Family History Family History  Problem Relation Age of Onset   Diabetes Mother    Hypertension Mother    Diabetes Father    Hypertension Father     Social History Social History   Tobacco Use   Smoking status: Every Day    Packs/day: 0.75    Types: Cigarettes   Smokeless tobacco: Never  Vaping Use   Vaping Use: Never used  Substance Use Topics   Alcohol use: No   Drug use: Never     Allergies   Patient has no known allergies.   Review of Systems Review of Systems  Constitutional:  Negative for chills and fever.  HENT:  Positive for congestion. Negative for ear pain and sore throat.   Eyes:  Negative for pain and visual disturbance.  Respiratory:  Positive for cough. Negative for shortness of breath and wheezing.   Cardiovascular:  Negative for chest pain and palpitations.  Gastrointestinal:  Positive for abdominal pain (epigastric discomfort). Negative for vomiting.  Genitourinary:  Negative for dysuria and hematuria.  Musculoskeletal:  Positive for myalgias. Negative for back pain.  Skin:  Negative for color change and rash.  Neurological:  Negative for seizures  and syncope.  All other systems reviewed and are negative.   Physical Exam Triage Vital Signs ED Triage Vitals  Enc Vitals Group     BP 08/09/21 1137 (!) 147/84     Pulse Rate 08/09/21 1137 78     Resp 08/09/21 1137 20     Temp 08/09/21 1137 97.9 F (36.6 C)     Temp Source 08/09/21 1137 Oral     SpO2 08/09/21 1137 99 %     Weight --      Height --      Head Circumference --      Peak Flow --      Pain Score 08/09/21 1135 6     Pain Loc --      Pain Edu? --      Excl. in GC? --    No data found.  Updated Vital Signs BP (!) 147/84 (BP Location: Right Arm)    Pulse 78     Temp 97.9 F (36.6 C) (Oral)    Resp 20    LMP 03/16/2016    SpO2 99%   Visual Acuity Right Eye Distance:   Left Eye Distance:   Bilateral Distance:    Right Eye Near:   Left Eye Near:    Bilateral Near:     Physical Exam Vitals and nursing note reviewed.  Constitutional:      General: She is not in acute distress.    Appearance: She is well-developed.  HENT:     Head: Normocephalic and atraumatic.  Eyes:     Conjunctiva/sclera: Conjunctivae normal.  Cardiovascular:     Rate and Rhythm: Normal rate and regular rhythm.     Heart sounds: No murmur heard. Pulmonary:     Effort: Pulmonary effort is normal. No respiratory distress.     Breath sounds: Normal breath sounds.  Abdominal:     Palpations: Abdomen is soft.     Tenderness: There is no abdominal tenderness.  Musculoskeletal:        General: No swelling.     Cervical back: Neck supple.  Skin:    General: Skin is warm and dry.     Capillary Refill: Capillary refill takes less than 2 seconds.  Neurological:     Mental Status: She is alert.  Psychiatric:        Mood and Affect: Mood normal.     UC Treatments / Results  Labs (all labs ordered are listed, but only abnormal results are displayed) Labs Reviewed - No data to display  EKG   Radiology No results found.  Procedures Procedures (including critical care time)  Medications Ordered in UC Medications - No data to display  Initial Impression / Assessment and Plan / UC Course  I have reviewed the triage vital signs and the nursing notes.  Pertinent labs & imaging results that were available during my care of the patient were reviewed by me and considered in my medical decision making (see chart for details).     Acute sinusitis, antibiotic prescribed.  Supportive care discussed  GERD, epigastric discomfort likely related to acid reflux.  Advised daily omeprazole.  Food choices discussed.  Return precautions discussed.  Final Clinical  Impressions(s) / UC Diagnoses   Final diagnoses:  Acute non-recurrent sinusitis, unspecified location  Gastroesophageal reflux disease without esophagitis     Discharge Instructions      Take medications as prescribed Drink plenty of fluids Rest Recommend omeprazole over the counter daily for acid reflux  ED Prescriptions     Medication Sig Dispense Auth. Provider   amoxicillin-clavulanate (AUGMENTIN) 875-125 MG tablet Take 1 tablet by mouth every 12 (twelve) hours. 14 tablet Ward, Lenise Arena, PA-C   promethazine-dextromethorphan (PROMETHAZINE-DM) 6.25-15 MG/5ML syrup Take 5 mLs by mouth 4 (four) times daily as needed for cough. 118 mL Ward, Janett Billow Z, PA-C   fluticasone (FLONASE) 50 MCG/ACT nasal spray Place 2 sprays into both nostrils daily. 9.9 mL Ward, Lenise Arena, PA-C      PDMP not reviewed this encounter.   Ward, Lenise Arena, PA-C 08/09/21 1159

## 2021-08-15 ENCOUNTER — Emergency Department (HOSPITAL_COMMUNITY): Payer: BC Managed Care – PPO

## 2021-08-15 ENCOUNTER — Emergency Department (HOSPITAL_COMMUNITY)
Admission: EM | Admit: 2021-08-15 | Discharge: 2021-08-15 | Disposition: A | Discharge status: Home / Self Care | Payer: BC Managed Care – PPO | Attending: Emergency Medicine | Admitting: Emergency Medicine

## 2021-08-15 ENCOUNTER — Other Ambulatory Visit: Payer: Self-pay

## 2021-08-15 ENCOUNTER — Encounter (HOSPITAL_COMMUNITY): Payer: Self-pay | Admitting: Emergency Medicine

## 2021-08-15 DIAGNOSIS — E871 Hypo-osmolality and hyponatremia: Secondary | ICD-10-CM | POA: Diagnosis not present

## 2021-08-15 DIAGNOSIS — K2980 Duodenitis without bleeding: Secondary | ICD-10-CM | POA: Insufficient documentation

## 2021-08-15 DIAGNOSIS — K859 Acute pancreatitis without necrosis or infection, unspecified: Secondary | ICD-10-CM | POA: Diagnosis not present

## 2021-08-15 DIAGNOSIS — R1011 Right upper quadrant pain: Secondary | ICD-10-CM

## 2021-08-15 DIAGNOSIS — Z7951 Long term (current) use of inhaled steroids: Secondary | ICD-10-CM | POA: Diagnosis not present

## 2021-08-15 DIAGNOSIS — R1013 Epigastric pain: Secondary | ICD-10-CM

## 2021-08-15 LAB — URINALYSIS, ROUTINE W REFLEX MICROSCOPIC
Bacteria, UA: NONE SEEN
Bilirubin Urine: NEGATIVE
Glucose, UA: NEGATIVE mg/dL
Ketones, ur: 5 mg/dL — AB
Nitrite: NEGATIVE
Protein, ur: NEGATIVE mg/dL
Specific Gravity, Urine: 1.021 (ref 1.005–1.030)
pH: 6 (ref 5.0–8.0)

## 2021-08-15 LAB — COMPREHENSIVE METABOLIC PANEL
ALT: 16 U/L (ref 0–44)
AST: 14 U/L — ABNORMAL LOW (ref 15–41)
Albumin: 4.3 g/dL (ref 3.5–5.0)
Alkaline Phosphatase: 82 U/L (ref 38–126)
Anion gap: 8 (ref 5–15)
BUN: 14 mg/dL (ref 6–20)
CO2: 23 mmol/L (ref 22–32)
Calcium: 9.2 mg/dL (ref 8.9–10.3)
Chloride: 103 mmol/L (ref 98–111)
Creatinine, Ser: 0.56 mg/dL (ref 0.44–1.00)
GFR, Estimated: >60 mL/min (ref >60)
Glucose, Bld: 106 mg/dL — ABNORMAL HIGH (ref 70–99)
Potassium: 3.9 mmol/L (ref 3.5–5.1)
Sodium: 134 mmol/L — ABNORMAL LOW (ref 135–145)
Total Bilirubin: 0.3 mg/dL (ref 0.3–1.2)
Total Protein: 8.3 g/dL — ABNORMAL HIGH (ref 6.5–8.1)

## 2021-08-15 LAB — CBC
HCT: 43.8 % (ref 36.0–46.0)
Hemoglobin: 14.6 g/dL (ref 12.0–15.0)
MCH: 28 pg (ref 26.0–34.0)
MCHC: 33.3 g/dL (ref 30.0–36.0)
MCV: 84.1 fL (ref 80.0–100.0)
Platelets: 341 10*3/uL (ref 150–400)
RBC: 5.21 MIL/uL — ABNORMAL HIGH (ref 3.87–5.11)
RDW: 13.4 % (ref 11.5–15.5)
WBC: 7.1 10*3/uL (ref 4.0–10.5)
nRBC: 0 % (ref 0.0–0.2)

## 2021-08-15 LAB — LIPASE, BLOOD: Lipase: 103 U/L — ABNORMAL HIGH (ref 11–51)

## 2021-08-15 MED ORDER — KETOROLAC TROMETHAMINE 15 MG/ML IJ SOLN: 15.0000 mg | Freq: Once | INTRAMUSCULAR | Status: DC

## 2021-08-15 MED ORDER — OXYCODONE-ACETAMINOPHEN 5-325 MG PO TABS: 1.0000 | ORAL_TABLET | Freq: Four times a day (QID) | ORAL | 0 refills | Status: DC | PRN

## 2021-08-15 MED ORDER — IOHEXOL 300 MG/ML  SOLN
100.0000 mL | Freq: Once | INTRAMUSCULAR | Status: AC | PRN
  Administered 2021-08-15: 100 mL via INTRAVENOUS

## 2021-08-15 MED ORDER — ONDANSETRON 4 MG PO TBDP: 4.0000 mg | ORAL_TABLET | Freq: Three times a day (TID) | ORAL | 0 refills | Status: DC | PRN

## 2021-08-15 NOTE — ED Notes (Signed)
Pt. Aware of urine specimen. Will collect urine when pt. Voids. Nurse aware.  

## 2021-08-15 NOTE — ED Notes (Signed)
Patient assisted to bathroom to provide a urine sample ?

## 2021-08-15 NOTE — ED Triage Notes (Signed)
Patient complains of RUQ abdominal pain that radiates to her back, intermittent. States it is random, wakes her up from sleep. Has been taking PPI as prescribed, no relief. Denies N/V/D.  ?

## 2021-08-15 NOTE — Discharge Instructions (Addendum)
You were seen in the ER today for your abdominal pain.  ?You have mild pancreatitis, which is inflammation of the pancreas.  You also have some inflammation surrounding intestine.  Likely this is related to your increased alcohol use the week that your pain began. ?Please adjust your diet as we discussed; you have been prescribed pain medication and nausea medication to take as needed.  Please call and schedule appointment with the gastroenterologist first thing on Monday morning for close follow-up. ? ?Return to the ER with any worsening of your abdominal pain, failure to improve in your pain, nausea or vomiting, or any other new severe symptom. ?

## 2021-08-15 NOTE — ED Provider Notes (Signed)
?Physical Exam  ?BP (!) 152/102 (BP Location: Left Arm)   Pulse 76   Temp 98.1 ?F (36.7 ?C) (Oral)   Resp 17   Ht 5' 7.5" (1.715 m)   Wt 73 kg   LMP 03/16/2016   SpO2 100%   BMI 24.83 kg/m?  ? ?Physical Exam ?Vitals and nursing note reviewed.  ?Constitutional:   ?   Appearance: She is obese. She is not toxic-appearing.  ?HENT:  ?   Head: Normocephalic and atraumatic.  ?   Mouth/Throat:  ?   Mouth: Mucous membranes are moist.  ?   Pharynx: No oropharyngeal exudate or posterior oropharyngeal erythema.  ?Eyes:  ?   General:     ?   Right eye: No discharge.     ?   Left eye: No discharge.  ?   Conjunctiva/sclera: Conjunctivae normal.  ?Cardiovascular:  ?   Rate and Rhythm: Normal rate and regular rhythm.  ?   Pulses: Normal pulses.  ?   Heart sounds: Normal heart sounds.  ?Pulmonary:  ?   Effort: Pulmonary effort is normal. No respiratory distress.  ?   Breath sounds: Normal breath sounds. No wheezing or rales.  ?Abdominal:  ?   General: Bowel sounds are normal. There is no distension.  ?   Palpations: Abdomen is soft.  ?   Tenderness: There is abdominal tenderness in the epigastric area. There is no guarding or rebound. Negative signs include Murphy's sign.  ?Musculoskeletal:     ?   General: No deformity.  ?   Cervical back: Neck supple.  ?Skin: ?   General: Skin is warm and dry.  ?Neurological:  ?   Mental Status: She is alert. Mental status is at baseline.  ?Psychiatric:     ?   Mood and Affect: Mood normal.  ? ? ?Procedures  ?Procedures ? ?ED Course / MDM  ? ?Clinical Course as of 08/15/21 2107  ?Sat Aug 15, 2021  ?1920 EKG 12-Lead [RS]  ?  ?Clinical Course User Index ?[RS] Laiyah Exline, Eugene Gavia, PA-C  ? ?Medical Decision Making ?Amount and/or Complexity of Data Reviewed ?Labs: ordered. ?   Details: CBC without leukocytosis or anemia.  CMP with mild hyponatremia of 134 but normal renal function and without elevation in transaminases.  Lipase is elevated to 103.  Urine pending at this time. ?Radiology:  ordered and independent interpretation performed. ?   Details: Right upper quadrant ultrasound revealed distended gallbladder without acute cholecystitis.  Suspected 4 mm polyp in the gallbladder without biliary dilatation.  Mild hepatic steatosis and questionable hemangioma in the left lobe of the liver. ?ECG/medicine tests:  Decision-making details documented in ED Course. ? ?Risk ?Prescription drug management. ? ? ? ?Care of this patient assumed from preceding ED provider Lorin Roemhildt, PA-C at time of shift change.  Please see her associated note for further insight into the patient's ED course.  In brief patient is a 51 year old postmenopausal female presents with concern for intermittent right upper abdominal pain for 2 weeks without association to eating.  Pain radiates to her right flank.  Is on PPIs but states symptoms have not improved.  Denies any nausea, vomiting, diarrhea, fevers, melena, hematochezia or urinary symptoms. ? ?At time of shift change patient is awaiting CT of the abdomen pelvis to further evaluate for ongoing pain and elevated lipase. Analgesia offered but patient was in the emergency department but she continued to decline this offer throughout her stay as she is driving herself home tonight. ? ?  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  Incidental finding of 2.4 cm mass in the left liver, patient informed and recommend follow-up with PCP. ? ?Regarding pancreatitis, shared decision making with the patient regarding disposition.  Admission was offered, however patient prefers to be discharged home.  Given pancreatitis with lipase less than 3 times the upper limit of normal, patient tolerating p.o. without any vomiting, and normal vital signs, do not feel this is unreasonable.  Extensive discussion with the patient regarding pancreatic rest with clear liquid diet for the next 48 hours and progression of diet from that point on. ? ?We will discharge  with short course of oral antiemetic and analgesia and recommendation for close outpatient GI follow-up. ? ?Sylvie voiced understanding of her medical evaluation and treatment plan.  Each of her questions was answered to her expressed satisfaction.  Strict return precautions were given.  Patient is well-appearing, stable, and was discharged in good condition. ? ?This chart was dictated using voice recognition software, Dragon. Despite the best efforts of this provider to proofread and correct errors, errors may still occur which can change documentation meaning. ? ? ?  ?Paris Lore, PA-C ?08/15/21 2110 ? ?  ?Tegeler, Canary Brim, MD ?08/15/21 2300 ? ?

## 2021-08-15 NOTE — ED Provider Notes (Signed)
[facility]-EMERGENCY DEPT Provider Note   CSN: [id number] Arrival date & time: 08/15/21  1700     History  Chief Complaint  Patient presents with   Abdominal Pain    Ruth Collins is a 51 y.o. female who presents emergency department complaining of abdominal pain for 2 weeks. Patient states her pain is worse in her right upper quadrant, and it radiates to her back.  She describes it is intermittent, and saying that it is "random".  She states that it often wakes her up from sleep.  She has been taking her PPI as prescribed, providing no relief.  She denies nausea, vomiting, diarrhea, fevers.  Abdominal Pain Associated symptoms: no chest pain, no chills, no constipation, no diarrhea, no dysuria, no fever, no hematuria, no nausea, no shortness of breath and no vomiting       Home Medications Prior to Admission medications   Medication Sig Start Date End Date Taking? Authorizing Provider  omeprazole (PRILOSEC) 20 MG capsule Take 20 mg by mouth daily.   Yes [provider]  amoxicillin-clavulanate (AUGMENTIN) 875-125 MG tablet Take 1 tablet by mouth every 12 (twelve) hours. Patient taking differently: Take 1 tablet by mouth every 12 (twelve) hours. Start date: 08/09/21 08/09/21   Ward, Tylene Fantasia, PA-C  benzonatate (TESSALON) 100 MG capsule Take 1 capsule (100 mg total) by mouth every 8 (eight) hours. Patient not taking: Reported on 08/09/2021 10/09/20   Raspet, Noberto Retort, PA-C  Cetirizine HCl 10 MG CAPS Take 1 capsule (10 mg total) by mouth daily for 10 days. Patient not taking: Reported on 08/09/2021 11/20/19 11/30/19  Wieters, Hallie C, PA-C  fluticasone (FLONASE) 50 MCG/ACT nasal spray Place 2 sprays into both nostrils daily. 08/09/21   Ward, Tylene Fantasia, PA-C  ibuprofen (ADVIL,MOTRIN) 600 MG tablet Take 1 tablet (600 mg total) by mouth every 6 (six) hours as needed. Patient not taking: Reported on 08/15/2021 03/30/16   Marlon Pel, PA-C   promethazine-dextromethorphan (PROMETHAZINE-DM) 6.25-15 MG/5ML syrup Take 5 mLs by mouth 4 (four) times daily as needed for cough. 08/09/21   Ward, Tylene Fantasia, PA-C      Allergies    Patient has no known allergies.    Review of Systems   Review of Systems  Constitutional:  Negative for chills and fever.  Respiratory:  Negative for shortness of breath.   Cardiovascular:  Negative for chest pain.  Gastrointestinal:  Positive for abdominal pain. Negative for constipation, diarrhea, nausea and vomiting.  Genitourinary:  Negative for dysuria, frequency, hematuria and urgency.  All other systems reviewed and are negative.  Physical Exam Updated Vital Signs BP (!) 152/102 (BP Location: Left Arm)    Pulse 76    Temp 98.1 F (36.7 C) (Oral)    Resp 17    Ht 5' 7.5" (1.715 m)    Wt 73 kg    LMP 03/16/2016    SpO2 100%    BMI 24.83 kg/m  Physical Exam Vitals and nursing note reviewed.  Constitutional:      Appearance: Normal appearance.  HENT:     Head: Normocephalic and atraumatic.  Eyes:     Conjunctiva/sclera: Conjunctivae normal.  Cardiovascular:     Rate and Rhythm: Normal rate and regular rhythm.  Pulmonary:     Effort: Pulmonary effort is normal. No respiratory distress.     Breath sounds: Normal breath sounds.  Abdominal:     General: There is no distension.     Palpations: Abdomen is soft.  Tenderness: There is generalized abdominal tenderness and tenderness in the right upper quadrant. There is right CVA tenderness. There is no left CVA tenderness, guarding or rebound.  Skin:    General: Skin is warm and dry.  Neurological:     General: No focal deficit present.     Mental Status: She is alert.    ED Results / Procedures / Treatments   Labs (all labs ordered are listed, but only abnormal results are displayed) Labs Reviewed  LIPASE, BLOOD - Abnormal; Notable for the following components:      Result Value   Lipase 103 (*)    All other components within normal  limits  COMPREHENSIVE METABOLIC PANEL - Abnormal; Notable for the following components:   Sodium 134 (*)    Glucose, Bld 106 (*)    Total Protein 8.3 (*)    AST 14 (*)    All other components within normal limits  CBC - Abnormal; Notable for the following components:   RBC 5.21 (*)    All other components within normal limits  URINALYSIS, ROUTINE W REFLEX MICROSCOPIC    EKG None  Radiology US Abdomen Limited RUQ (LIVER/GB)  Result Date: 08/15/2021 CLINICAL DATA:  Right upper quadrant pain. EXAM: ULTRASOUND ABDOMEN LIMITED RIGHT UPPER QUADRANT COMPARISON:  None. FINDINGS: Gallbladder: Only minimally distended. Gallbladder wall thickness of 4 mm is not unexpected given degree of distension. There is a 4 mm echogenic focus that is adherent to the gallbladder wall likely representing a polyp rather than non mobile stone. No definite gallstones. No sonographic Murphy sign noted by sonographer. Common bile duct: Diameter: 3 mm, normal. Liver: There is an 8 mm hyperechoic lesion in the left lobe. Borderline background increased echogenicity. No capsular nodularity. Portal vein is patent on color Doppler imaging with normal direction of blood flow towards the liver. Other: No right upper quadrant ascites. IMPRESSION: 1. Partially distended gallbladder without findings of acute cholecystitis. There is a suspected 4 mm gallbladder polyp. Given size, no dedicated further characterization or follow-up is needed. 2. No biliary dilatation. 3. Borderline increased hepatic parenchymal echogenicity suggests mild steatosis. A subcentimeter hyperechoic lesion in the left hepatic lobe typically represents incidental hemangioma. Follow-up ultrasound in 6 months could be considered to establish imaging stability. Electronically Signed   By: Narda Rutherford M.D.   On: 08/15/2021 18:31    Procedures Procedures    Medications Ordered in ED Medications - No data to display  ED Course/ Medical Decision Making/  A&P                           Medical Decision Making Amount and/or Complexity of Data Reviewed Labs: ordered. Radiology: ordered.   This patient presents to the ED for concern of abdominal pain, this involves an extensive number of treatment options, and is a complaint that carries with it a high risk of complications and morbidity. The emergent differential diagnosis prior to evaluation includes, but is not limited to,  AAA, mesenteric ischemia, appendicitis, diverticulitis, DKA, gastritis, gastroenteritis, nephrolithiasis, pancreatitis, peritonitis, adrenal insufficiency, intestinal ischemia, constipation, UTI, SBO/LBO, splenic rupture, biliary disease, IBD, IBS, PUD, or hepatitis, ovarian torsion, PID.   Past Medical History / Co-morbidities / Social history: Smokes 0.75 packs/day, Postmenopausal  Physical Exam: Physical exam performed. The pertinent findings include: Patient is afebrile, not tachycardic, not hypoxic, no acute distress.  Patient has generalized tenderness to palpation of the abdomen, worse in the right upper quadrant.  Right CVA  tenderness.  No guarding or rebound.  Lab Tests: I ordered, and personally interpreted labs.  The pertinent results include: No leukocytosis, hemoglobin normal. Electrolytes grossly within normal limits. Glucose 106. Lipase 103.    Imaging Studies: I ordered imaging studies including right upper quadrant ultrasound. I independently visualized and interpreted imaging which showed questionable gallbladder polyp vs. gallstone. I agree with the radiologist interpretation. I ordered a CT abdomen pelvis for further evaluation. These results are pending at time of shift change.    Cardiac Monitoring:  The patient was maintained on a cardiac monitor.  My attending physician Dr. Rush Landmark viewed and interpreted the cardiac monitored which showed an underlying rhythm of: normal sinus rhythm. I agree with this interpretation.   Disposition: Patient  discussed and care transferred to oncoming provider Sponseller PA-C at shift change. See her note for disposition. The plan is to follow up the patient's CT scan for possible admission. If she is pain controlled with reassuring scan, I anticipate she can discharge to home and follow up with PCP vs. Gastroenterology.   Final Clinical Impression(s) / ED Diagnoses Final diagnoses:  RUQ pain  Epigastric pain    Rx / DC Orders ED Discharge Orders     None      Portions of this report may have been transcribed using voice recognition software. Every effort was made to ensure accuracy; however, inadvertent computerized transcription errors may be present.    Jeanella Flattery 08/15/21 1903    Tegeler, Canary Brim, MD 08/15/21 [phone]

## 2021-09-02 ENCOUNTER — Other Ambulatory Visit: Payer: Self-pay | Admitting: Gastroenterology

## 2021-09-02 ENCOUNTER — Other Ambulatory Visit (HOSPITAL_COMMUNITY): Payer: Self-pay | Admitting: Gastroenterology

## 2021-09-02 DIAGNOSIS — K769 Liver disease, unspecified: Secondary | ICD-10-CM

## 2022-01-07 ENCOUNTER — Ambulatory Visit (HOSPITAL_COMMUNITY)
Admission: EM | Admit: 2022-01-07 | Discharge: 2022-01-07 | Disposition: A | Discharge status: Home / Self Care | Payer: BC Managed Care – PPO | Attending: Sports Medicine | Admitting: Sports Medicine

## 2022-01-07 ENCOUNTER — Encounter (HOSPITAL_COMMUNITY): Payer: Self-pay

## 2022-01-07 DIAGNOSIS — K0889 Other specified disorders of teeth and supporting structures: Secondary | ICD-10-CM

## 2022-01-07 DIAGNOSIS — S025XXS Fracture of tooth (traumatic), sequela: Secondary | ICD-10-CM | POA: Diagnosis not present

## 2022-01-07 DIAGNOSIS — K029 Dental caries, unspecified: Secondary | ICD-10-CM

## 2022-01-07 MED ORDER — AMOXICILLIN-POT CLAVULANATE 875-125 MG PO TABS: 1.0000 | ORAL_TABLET | Freq: Two times a day (BID) | ORAL | 0 refills | Status: AC

## 2022-01-07 NOTE — ED Triage Notes (Signed)
Pt c/o right lower dental pain and facial swelling.

## 2022-01-07 NOTE — Discharge Instructions (Signed)
Continue with antibiotics, Augmentin, twice a day for 10 days I would recommend calling today to schedule dental appointment.You can apply heat or ice whichever is more comfortable to the side of your face for some pain relief. If you begin to have increased pain would recommend 800 mg of ibuprofen 3 times a day, would recommend also taking with food to avoid upset stomach. If you begin having any trouble swallowing or increased drooling please present to the closest ER or urgent care.

## 2022-01-07 NOTE — ED Provider Notes (Signed)
[facility]    CSN: [id number] Arrival date & time: 01/07/22  1105      History   Chief Complaint Chief Complaint  Patient presents with   Dental Pain    HPI Ruth Collins is a 51 y.o. female.   She presents today to urgent care with chief complaint of right-sided lower dental pain.  She reports she woke up this morning with the swelling and mild discomfort.  She reports that she broke those 2 bottom teeth of approximately 1 month ago and that the pain had subsided until this morning. She reports she has had this happen in the past that required dental intervention. She has not yet been able to see the dentist since she broke those teeth off last month. She denies any fevers, chills, neck pain, drooling, or trouble swallowing.  No past medical history on file.  There are no problems to display for this patient.   Past Surgical History:  Procedure Laterality Date   TUBAL LIGATION      OB History     Gravida  5   Para      Term      Preterm      AB      Living  5      SAB      IAB      Ectopic      Multiple      Live Births               Home Medications    Prior to Admission medications   Medication Sig Start Date End Date Taking? Authorizing Provider  amoxicillin-clavulanate (AUGMENTIN) 875-125 MG tablet Take 1 tablet by mouth every 12 (twelve) hours for 10 days. 01/07/22 01/17/22 Yes Claudie Leach, MD    Family History Family History  Problem Relation Age of Onset   Diabetes Mother    Hypertension Mother    Diabetes Father    Hypertension Father     Social History Social History   Tobacco Use   Smoking status: Every Day    Packs/day: 0.50    Types: Cigarettes   Smokeless tobacco: Never  Vaping Use   Vaping Use: Never used  Substance Use Topics   Alcohol use: Yes    Comment: social   Drug use: Never     Allergies   Patient has no known allergies.   Review of Systems Review of Systems As listed  above in HPI  Physical Exam Triage Vital Signs ED Triage Vitals  Enc Vitals Group     BP 01/07/22 1136 (!) 156/87     Pulse Rate 01/07/22 1136 66     Resp 01/07/22 1136 18     Temp 01/07/22 1136 97.7 F (36.5 C)     Temp Source 01/07/22 1136 Oral     SpO2 01/07/22 1136 97 %     Weight --      Height --      Head Circumference --      Peak Flow --      Pain Score 01/07/22 1139 0     Pain Loc --      Pain Edu? --      Excl. in GC? --    No data found.  Updated Vital Signs BP (!) 156/87 (BP Location: Right Arm)   Pulse 66   Temp 97.7 F (36.5 C) (Oral)   Resp 18   LMP 03/16/2016   SpO2 97%   Physical  Exam Vitals reviewed.  Constitutional:      General: She is not in acute distress.    Appearance: Normal appearance. She is not ill-appearing, toxic-appearing or diaphoretic.     Comments: She is speaking in full sentences with no distress, no drooling or excessive secretions noted.  HENT:     Mouth/Throat:     Mouth: Mucous membranes are moist.     Dentition: Abnormal dentition. Dental tenderness, gingival swelling, dental caries and gum lesions present.     Tongue: No lesions. Tongue does not deviate from midline.     Pharynx: Oropharynx is clear. Uvula midline.     Tonsils: No tonsillar abscesses.      Comments: Swelling of the right lower cheek in the maxilla near the angle of the jaw. Patient had broken off teeth, as shown above, from the gum level.  Erythematous gums around the area with some swelling.  No drainage noted.  Poor dentition.  Tenderness to palpation over the gums of those teeth. Pulmonary:     Effort: Pulmonary effort is normal.  Musculoskeletal:     Cervical back: Neck supple. No tenderness.  Lymphadenopathy:     Cervical: No cervical adenopathy.  Neurological:     Mental Status: She is alert.    UC Treatments / Results  Labs (all labs ordered are listed, but only abnormal results are displayed) Labs Reviewed - No data to  display  EKG   Radiology No results found.  Procedures Procedures (including critical care time)  Medications Ordered in UC Medications - No data to display  Initial Impression / Assessment and Plan / UC Course  I have reviewed the triage vital signs and the nursing notes.  Pertinent labs & imaging results that were available during my care of the patient were reviewed by me and considered in my medical decision making (see chart for details).    Patient presents today after waking up with facial swelling and some dental discomfort.  It appears she has a dental infection after broken off teeth last month.  Recommend urgent evaluation by dentistry, she given handout for low-cost community dental resources and discharged today with prescription for Augmentin.  She currently reports minimal to no pain at rest, I did discuss with her that she may use over-the-counter NSAIDs, Advil not to exceed 2400 mg in a 24-hour period. She was also counseled on return to ER or urgent care precautions.  She verbalized understanding. Final Clinical Impressions(s) / UC Diagnoses   Final diagnoses:  Pain, dental  Dental caries  Closed fracture of tooth, sequela     Discharge Instructions      Continue with antibiotics, Augmentin, twice a day for 10 days I would recommend calling today to schedule dental appointment.You can apply heat or ice whichever is more comfortable to the side of your face for some pain relief. If you begin to have increased pain would recommend 800 mg of ibuprofen 3 times a day, would recommend also taking with food to avoid upset stomach. If you begin having any trouble swallowing or increased drooling please present to the closest ER or urgent care.     ED Prescriptions     Medication Sig Dispense Auth. Provider   amoxicillin-clavulanate (AUGMENTIN) 875-125 MG tablet Take 1 tablet by mouth every 12 (twelve) hours for 10 days. 20 tablet Claudie Leach, MD       PDMP not reviewed this encounter.   Claudie Leach, MD 01/07/22 1210

## 2022-04-20 ENCOUNTER — Ambulatory Visit (HOSPITAL_COMMUNITY)
Admission: RE | Admit: 2022-04-20 | Discharge: 2022-04-20 | Disposition: A | Discharge status: Home / Self Care | Payer: BC Managed Care – PPO | Source: Ambulatory Visit | Attending: Family Medicine | Admitting: Family Medicine

## 2022-04-20 ENCOUNTER — Encounter (HOSPITAL_COMMUNITY): Payer: Self-pay

## 2022-04-20 VITALS — BP 157/92 | HR 78 | Temp 98.9°F | Resp 18

## 2022-04-20 DIAGNOSIS — K047 Periapical abscess without sinus: Secondary | ICD-10-CM | POA: Diagnosis present

## 2022-04-20 DIAGNOSIS — Z1152 Encounter for screening for COVID-19: Secondary | ICD-10-CM | POA: Diagnosis not present

## 2022-04-20 DIAGNOSIS — J069 Acute upper respiratory infection, unspecified: Secondary | ICD-10-CM | POA: Insufficient documentation

## 2022-04-20 MED ORDER — AMOXICILLIN 875 MG PO TABS: 875.0000 mg | ORAL_TABLET | Freq: Two times a day (BID) | ORAL | 0 refills | Status: AC

## 2022-04-20 MED ORDER — BENZONATATE 100 MG PO CAPS: 100.0000 mg | ORAL_CAPSULE | Freq: Three times a day (TID) | ORAL | 0 refills | Status: DC | PRN

## 2022-04-20 NOTE — ED Provider Notes (Addendum)
[place]    CSN: [id number] Arrival date & time: 04/20/22  1353      History   Chief Complaint Chief Complaint  Patient presents with   Cough   Nasal Congestion   Headache   Dizziness    HPI Ruth Collins is a 51 y.o. female.    Cough Associated symptoms: headaches   Headache Associated symptoms: cough and dizziness   Dizziness Associated symptoms: headaches    Here for nasal congestion and cough that began on November 3.  She has not had any fever or vomiting.  No shortness of breath and she does not have a history of asthma.  She is postmenopausal with her last period being in 2017   She also notes pain and some swelling around an upper tooth on the right. History reviewed. No pertinent past medical history.  There are no problems to display for this patient.   Past Surgical History:  Procedure Laterality Date   TUBAL LIGATION      OB History     Gravida  5   Para      Term      Preterm      AB      Living  5      SAB      IAB      Ectopic      Multiple      Live Births               Home Medications    Prior to Admission medications   Medication Sig Start Date End Date Taking? Authorizing Provider  amoxicillin (AMOXIL) 875 MG tablet Take 1 tablet (875 mg total) by mouth 2 (two) times daily for 7 days. 04/20/22 04/27/22 Yes Jeanie Mccard, Gwenlyn Perking, MD  benzonatate (TESSALON) 100 MG capsule Take 1 capsule (100 mg total) by mouth 3 (three) times daily as needed for cough. 04/20/22  Yes Emanuell Morina, Gwenlyn Perking, MD    Family History Family History  Problem Relation Age of Onset   Diabetes Mother    Hypertension Mother    Diabetes Father    Hypertension Father     Social History Social History   Tobacco Use   Smoking status: Every Day    Packs/day: 0.50    Types: Cigarettes   Smokeless tobacco: Never  Vaping Use   Vaping Use: Never used  Substance Use Topics   Alcohol use: Yes    Comment: social   Drug use:  Never     Allergies   Patient has no known allergies.   Review of Systems Review of Systems  Respiratory:  Positive for cough.   Neurological:  Positive for dizziness and headaches.     Physical Exam Triage Vital Signs ED Triage Vitals  Enc Vitals Group     BP 04/20/22 1436 (!) 157/92     Pulse Rate 04/20/22 1436 78     Resp 04/20/22 1436 18     Temp 04/20/22 1436 98.9 F (37.2 C)     Temp Source 04/20/22 1436 Oral     SpO2 04/20/22 1436 96 %     Weight --      Height --      Head Circumference --      Peak Flow --      Pain Score 04/20/22 1435 4     Pain Loc --      Pain Edu? --      Excl. in [place]? --  No data found.  Updated Vital Signs BP (!) 157/92 (BP Location: Right Arm)   Pulse 78   Temp 98.9 F (37.2 C) (Oral)   Resp 18   LMP 03/16/2016   SpO2 96%   Visual Acuity Right Eye Distance:   Left Eye Distance:   Bilateral Distance:    Right Eye Near:   Left Eye Near:    Bilateral Near:     Physical Exam Vitals reviewed.  Constitutional:      General: She is not in acute distress.    Appearance: She is not toxic-appearing.  HENT:     Right Ear: Tympanic membrane and ear canal normal.     Left Ear: Tympanic membrane and ear canal normal.     Nose: Congestion present.     Mouth/Throat:     Mouth: Mucous membranes are moist.     Comments: There is some clear mucus draining.  There is a little swelling around her right upper gum. Eyes:     Extraocular Movements: Extraocular movements intact.     Conjunctiva/sclera: Conjunctivae normal.     Pupils: Pupils are equal, round, and reactive to light.  Cardiovascular:     Rate and Rhythm: Normal rate and regular rhythm.     Heart sounds: No murmur heard. Pulmonary:     Effort: Pulmonary effort is normal. No respiratory distress.     Breath sounds: No stridor. No wheezing, rhonchi or rales.  Musculoskeletal:     Cervical back: Neck supple.  Lymphadenopathy:     Cervical: No cervical adenopathy.   Skin:    Capillary Refill: Capillary refill takes less than 2 seconds.     Coloration: Skin is not jaundiced or pale.  Neurological:     General: No focal deficit present.     Mental Status: She is alert and oriented to person, place, and time.  Psychiatric:        Behavior: Behavior normal.      UC Treatments / Results  Labs (all labs ordered are listed, but only abnormal results are displayed) Labs Reviewed  SARS CORONAVIRUS 2 (TAT 6-24 HRS)    EKG   Radiology No results found.  Procedures Procedures (including critical care time)  Medications Ordered in UC Medications - No data to display  Initial Impression / Assessment and Plan / UC Course  I have reviewed the triage vital signs and the nursing notes.  Pertinent labs & imaging results that were available during my care of the patient were reviewed by me and considered in my medical decision making (see chart for details).          COVID swab is done and if positive she is a candidate for Paxlovid.  Her last EGFR was over 60.  Amoxicillin is sent in for the dental infection  Final Clinical Impressions(s) / UC Diagnoses   Final diagnoses:  Viral URI with cough  Dental infection     Discharge Instructions      Take amoxicillin 875 mg--1 tab twice daily for 7 days  Take benzonatate 100 mg, 1 tab every 8 hours as needed for cough.   You have been swabbed for COVID, and the test will result in the next 24 hours. Our staff will call you if positive. If the test is positive, you should quarantine for 5 days from the start of your symptoms      ED Prescriptions     Medication Sig Dispense Auth. Provider   benzonatate (TESSALON) 100 MG  capsule Take 1 capsule (100 mg total) by mouth 3 (three) times daily as needed for cough. 21 capsule Barrett Henle, MD   amoxicillin (AMOXIL) 875 MG tablet Take 1 tablet (875 mg total) by mouth 2 (two) times daily for 7 days. 14 tablet Allyne Hebert, Gwenlyn Perking, MD       PDMP not reviewed this encounter.   Barrett Henle, MD 04/20/22 1458    Barrett Henle, MD 04/20/22 1459    Barrett Henle, MD 04/20/22 1500

## 2022-04-20 NOTE — Discharge Instructions (Addendum)
Take amoxicillin 875 mg--1 tab twice daily for 7 days  Take benzonatate 100 mg, 1 tab every 8 hours as needed for cough.   You have been swabbed for COVID, and the test will result in the next 24 hours. Our staff will call you if positive. If the test is positive, you should quarantine for 5 days from the start of your symptoms

## 2022-04-20 NOTE — ED Triage Notes (Signed)
Pt states she has cough, congestion, headache and she has been dizzy some started last week. She is taking nyquil without relief.

## 2022-04-21 LAB — SARS CORONAVIRUS 2 (TAT 6-24 HRS): SARS Coronavirus 2: NEGATIVE

## 2022-12-21 IMAGING — US US ABDOMEN LIMITED
1 series · 15 of 25 positions shown · non-contrast
Comparison: None.

CLINICAL DATA: Right upper quadrant pain.

EXAM:
ULTRASOUND ABDOMEN LIMITED RIGHT UPPER QUADRANT

[Series 1: us abdomen limited ruq mc & wl · 15 of 46 slices shown]
[im 1/46]
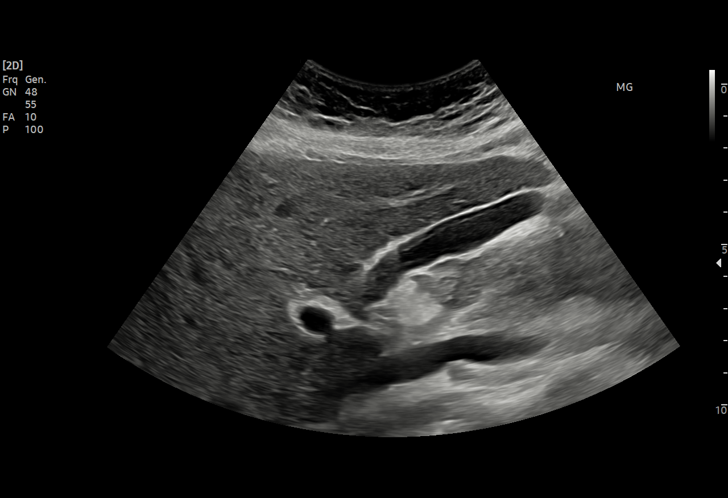
[im 4/46]
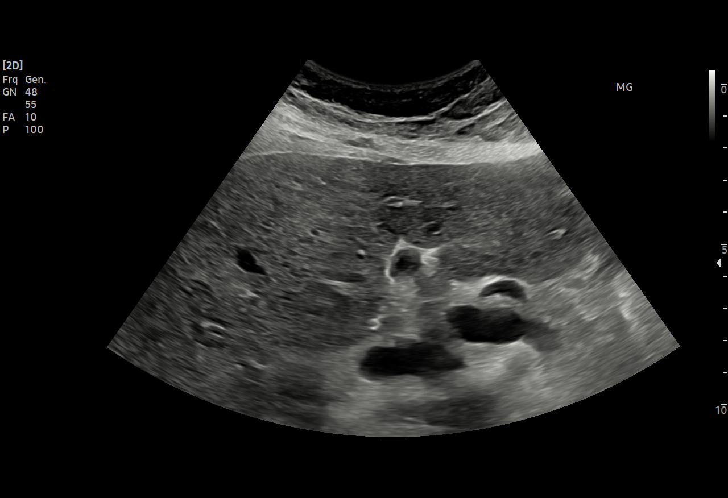
[im 8/46]
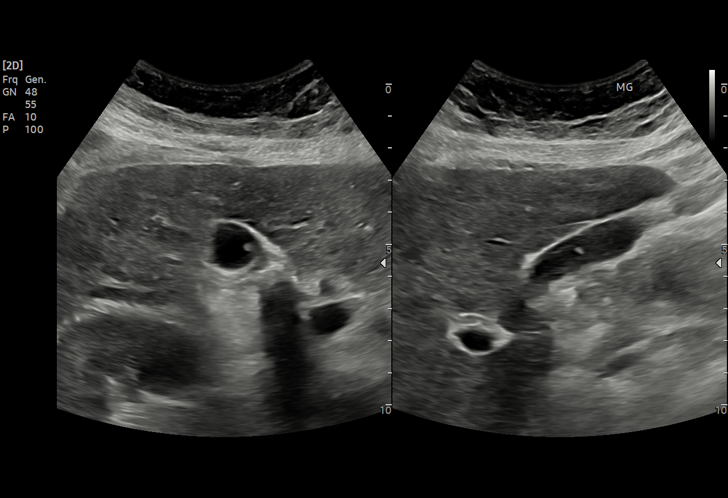
[im 10/46]
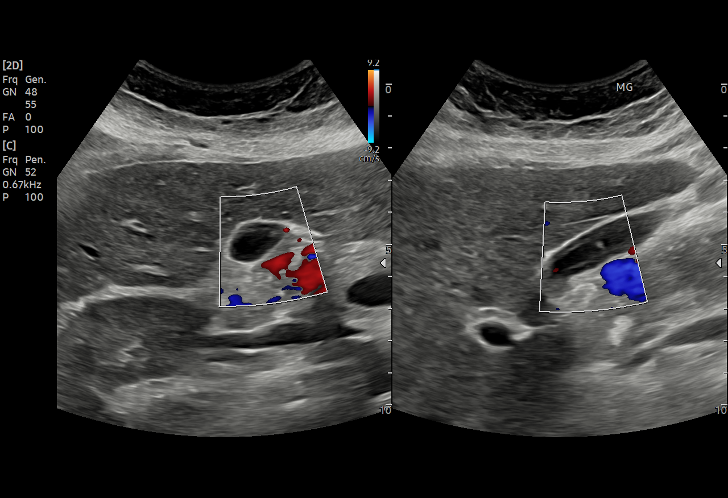
[im 14/46]
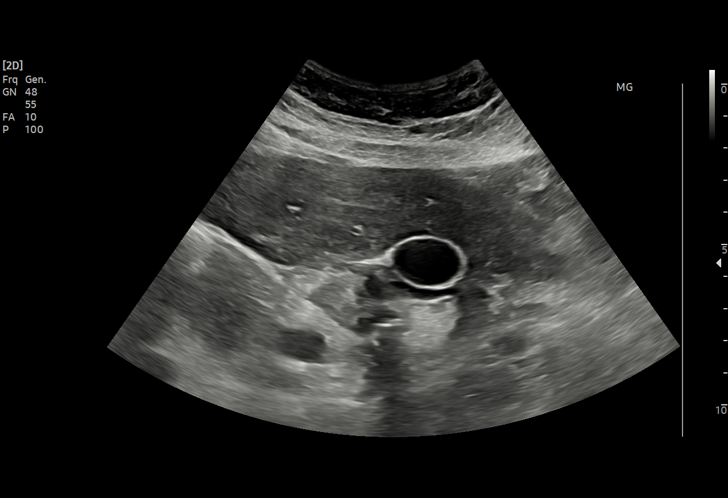
[im 17/46]
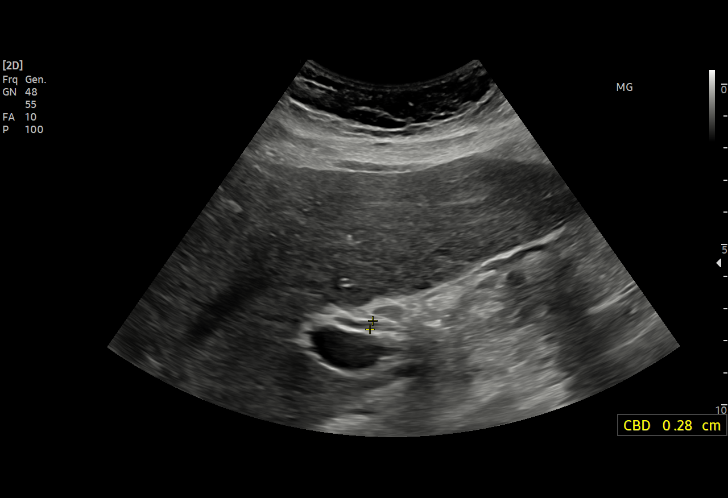
[im 19/46]
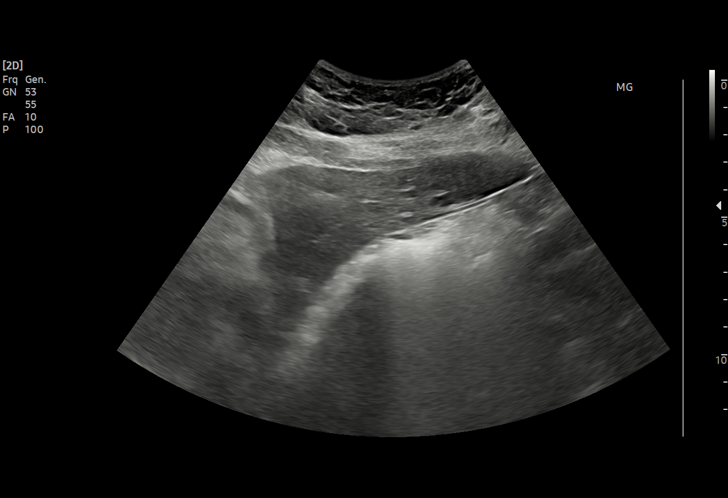
[im 23/46]
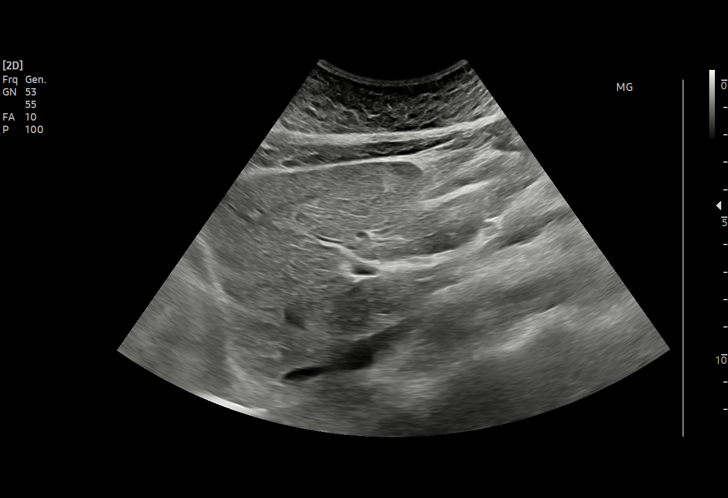
[im 27/46]
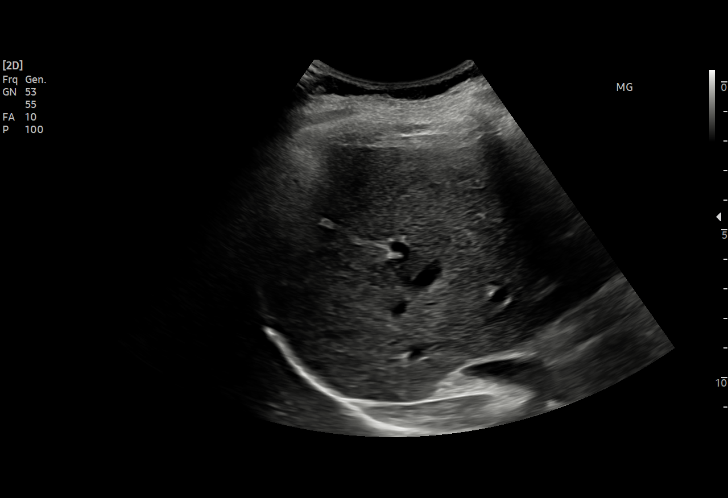
[im 29/46]
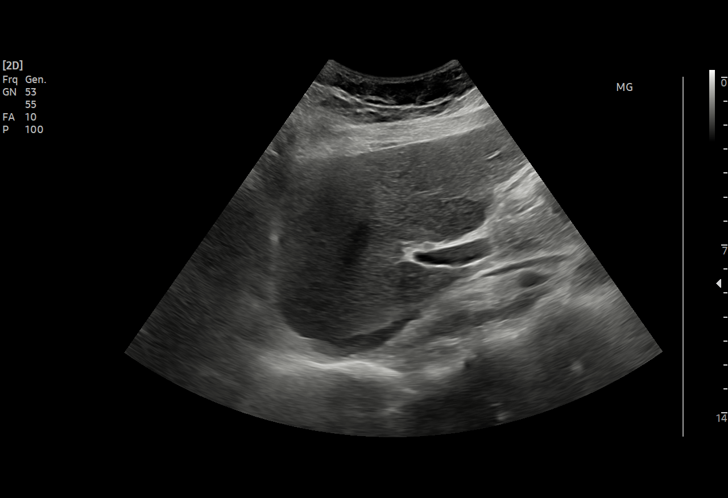
[im 32/46]
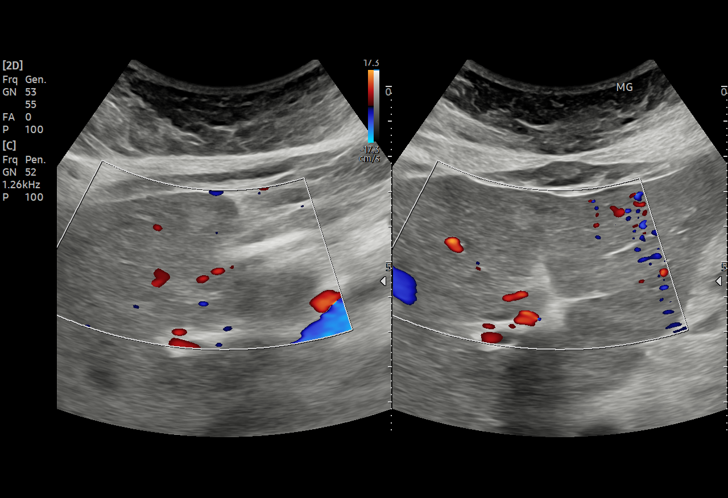
[im 36/46]
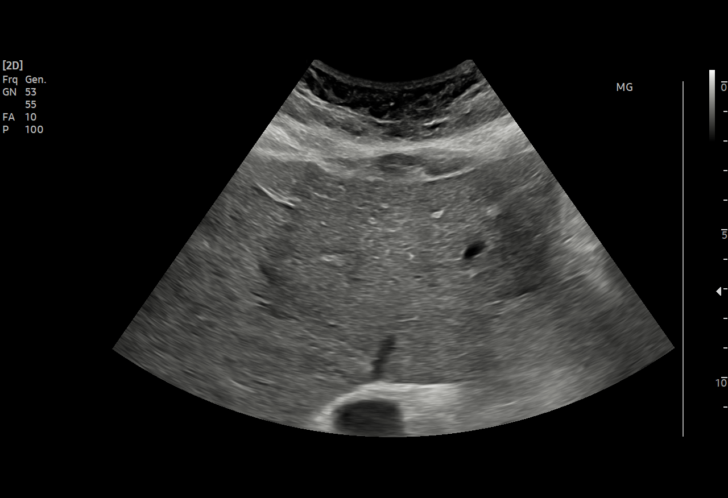
[im 38/46]
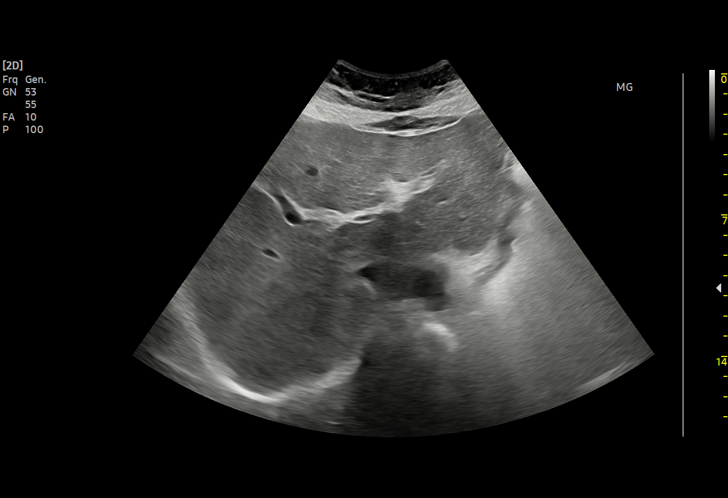
[im 42/46]
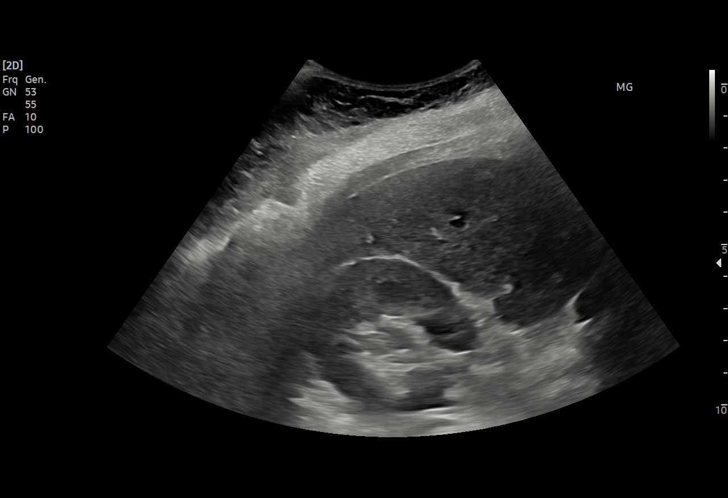
[im 46/46]
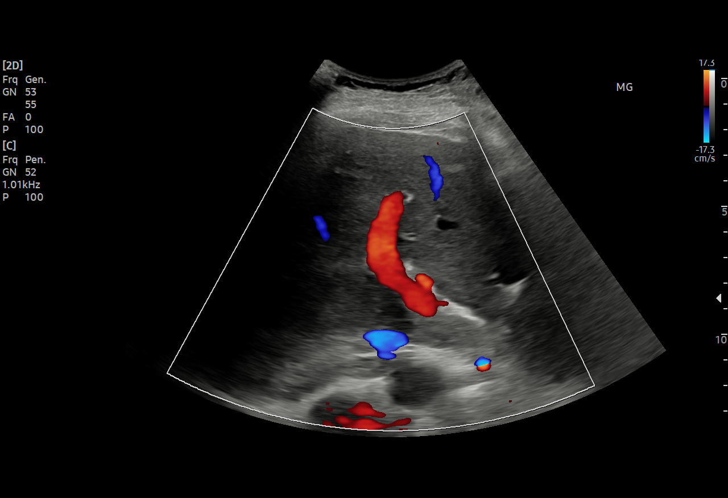

[15 of 25 positions shown; findings below may reference images not displayed]

FINDINGS: Gallbladder:

Only minimally distended. Gallbladder wall thickness of 4 mm is not
unexpected given degree of distension. There is a 4 mm echogenic
focus that is adherent to the gallbladder wall likely representing a
polyp rather than non mobile stone. No definite gallstones. No
sonographic Murphy sign noted by sonographer.

Common bile duct:

Diameter: 3 mm, normal.

Liver:

There is an 8 mm hyperechoic lesion in the left lobe. Borderline
background increased echogenicity. No capsular nodularity. Portal
vein is patent on color Doppler imaging with normal direction of
blood flow towards the liver.

Other: No right upper quadrant ascites.
IMPRESSION: 1. Partially distended gallbladder without findings of acute
cholecystitis. There is a suspected 4 mm gallbladder polyp. Given
size, no dedicated further characterization or follow-up is needed.
2. No biliary dilatation.
3. Borderline increased hepatic parenchymal echogenicity suggests
mild steatosis. A subcentimeter hyperechoic lesion in the left
hepatic lobe typically represents incidental hemangioma. Follow-up
ultrasound in 6 months could be considered to establish imaging
stability.

## 2023-02-15 ENCOUNTER — Encounter (HOSPITAL_COMMUNITY): Payer: Self-pay | Admitting: Emergency Medicine

## 2023-02-15 ENCOUNTER — Other Ambulatory Visit: Payer: Self-pay

## 2023-02-15 ENCOUNTER — Ambulatory Visit (HOSPITAL_COMMUNITY)
Admission: EM | Admit: 2023-02-15 | Discharge: 2023-02-15 | Disposition: A | Discharge status: Home / Self Care | Payer: BC Managed Care – PPO | Attending: Emergency Medicine | Admitting: Emergency Medicine

## 2023-02-15 DIAGNOSIS — Z20822 Contact with and (suspected) exposure to covid-19: Secondary | ICD-10-CM | POA: Insufficient documentation

## 2023-02-15 LAB — SARS CORONAVIRUS 2 (TAT 6-24 HRS): SARS Coronavirus 2: POSITIVE — AB

## 2023-02-15 NOTE — ED Provider Notes (Signed)
[facility]    CSN: [id number] Arrival date & time: 02/15/23  1202     History   Chief Complaint Chief Complaint  Patient presents with   Generalized Body Aches    HPI Ruth Collins is a 52 y.o. female.  Yesterday developed body aches, slight cough, runny nose Also has decreased ability to taste and smell Close covid exposure, son tested positive  Tried an OTC pain medication Denies fever, chills, sore throat, ear pain, shortness of breath, abdominal pain, NVD, rash  History reviewed. No pertinent past medical history.  There are no problems to display for this patient.   Past Surgical History:  Procedure Laterality Date   TUBAL LIGATION      OB History     Gravida  5   Para      Term      Preterm      AB      Living  5      SAB      IAB      Ectopic      Multiple      Live Births               Home Medications    Prior to Admission medications   Not on File    Family History Family History  Problem Relation Age of Onset   Diabetes Mother    Hypertension Mother    Diabetes Father    Hypertension Father     Social History Social History   Tobacco Use   Smoking status: Every Day    Current packs/day: 0.50    Types: Cigarettes   Smokeless tobacco: Never  Vaping Use   Vaping status: Never Used  Substance Use Topics   Alcohol use: Yes    Comment: social   Drug use: Never     Allergies   Patient has no known allergies.   Review of Systems Review of Systems As per HPI  Physical Exam Triage Vital Signs ED Triage Vitals  Encounter Vitals Group     BP 02/15/23 1314 134/89     Systolic BP Percentile --      Diastolic BP Percentile --      Pulse Rate 02/15/23 1314 83     Resp 02/15/23 1314 20     Temp 02/15/23 1314 99.7 F (37.6 C)     Temp Source 02/15/23 1314 Oral     SpO2 02/15/23 1314 96 %     Weight --      Height --      Head Circumference --      Peak Flow --      Pain Score 02/15/23  1312 8     Pain Loc --      Pain Education --      Exclude from Growth Chart --    No data found.  Updated Vital Signs BP 134/89 (BP Location: Right Arm)   Pulse 83   Temp 99.7 F (37.6 C) (Oral)   Resp 20   LMP 03/16/2016   SpO2 96%   Physical Exam Vitals and nursing note reviewed.  Constitutional:      General: She is not in acute distress.    Appearance: She is not ill-appearing.  HENT:     Right Ear: Tympanic membrane and ear canal normal.     Left Ear: Tympanic membrane and ear canal normal.     Nose: No congestion or rhinorrhea.     Mouth/Throat:  Mouth: Mucous membranes are moist.     Pharynx: Oropharynx is clear. No posterior oropharyngeal erythema.  Eyes:     Conjunctiva/sclera: Conjunctivae normal.  Cardiovascular:     Rate and Rhythm: Normal rate and regular rhythm.     Heart sounds: Normal heart sounds.  Pulmonary:     Effort: Pulmonary effort is normal.     Breath sounds: Normal breath sounds.  Musculoskeletal:        General: Normal range of motion.     Cervical back: Normal range of motion.  Lymphadenopathy:     Cervical: No cervical adenopathy.  Skin:    General: Skin is warm and dry.  Neurological:     Mental Status: She is alert and oriented to person, place, and time.     UC Treatments / Results  Labs (all labs ordered are listed, but only abnormal results are displayed) Labs Reviewed  SARS CORONAVIRUS 2 (TAT 6-24 HRS)    EKG  Radiology No results found.  Procedures Procedures (including critical care time)  Medications Ordered in UC Medications - No data to display  Initial Impression / Assessment and Plan / UC Course  I have reviewed the triage vital signs and the nursing notes.  Pertinent labs & imaging results that were available during my care of the patient were reviewed by me and considered in my medical decision making (see chart for details).  Afebrile and well-appearing.  Clear lungs. COVID test is  pending Discussed symptomatic care at home.  Mucinex, Tylenol or ibuprofen, fluids, rest, etc Patient without questions at this time, agrees to plan  Final Clinical Impressions(s) / UC Diagnoses   Final diagnoses:  Close exposure to COVID-19 virus     Discharge Instructions      We will call you if your covid test returns positive.  I recommend using mucinex for congestion and cough. It works best if you are very hydrated, so drink lots of fluids! Ibuprofen and/or Tylenol can be used for fever and body aches Get lots of rest     ED Prescriptions   None    PDMP not reviewed this encounter.   Marlow Baars, [place] 02/15/23 1436

## 2023-02-15 NOTE — ED Triage Notes (Signed)
Started having symptoms yesterday-02/14/2023.  Patient has general aches.  Patient reports a runny nose and inability to taste, cough.  child with patient tested positive 02/13/2023.    Patient took a medication "back and body" pain medicine

## 2023-02-15 NOTE — Discharge Instructions (Addendum)
We will call you if your covid test returns positive.  I recommend using mucinex for congestion and cough. It works best if you are very hydrated, so drink lots of fluids! Ibuprofen and/or Tylenol can be used for fever and body aches Get lots of rest
